# Patient Record
Sex: Male | Born: 1935 | Hispanic: No | Marital: Married
Health system: Southern US, Community
[De-identification: ages and names within clinical notes are randomized; demographics above are authoritative.]

---

## 2004-04-04 ENCOUNTER — Other Ambulatory Visit: Payer: Self-pay

## 2005-07-23 ENCOUNTER — Ambulatory Visit: Payer: Self-pay | Admitting: Unknown Physician Specialty

## 2005-08-20 ENCOUNTER — Ambulatory Visit: Payer: Self-pay | Admitting: Family Medicine

## 2006-04-12 ENCOUNTER — Ambulatory Visit: Payer: Self-pay | Admitting: Ophthalmology

## 2006-04-17 ENCOUNTER — Ambulatory Visit: Payer: Self-pay | Admitting: Ophthalmology

## 2006-06-19 ENCOUNTER — Ambulatory Visit: Payer: Self-pay | Admitting: Ophthalmology

## 2006-06-26 ENCOUNTER — Ambulatory Visit: Payer: Self-pay | Admitting: Ophthalmology

## 2007-04-25 ENCOUNTER — Ambulatory Visit: Payer: Self-pay | Admitting: Internal Medicine

## 2007-04-25 ENCOUNTER — Other Ambulatory Visit: Payer: Self-pay

## 2008-02-17 ENCOUNTER — Ambulatory Visit: Payer: Self-pay | Admitting: Nephrology

## 2008-11-03 ENCOUNTER — Inpatient Hospital Stay: Payer: Self-pay | Admitting: Internal Medicine

## 2008-11-11 ENCOUNTER — Ambulatory Visit: Payer: Self-pay | Admitting: Internal Medicine

## 2008-11-16 ENCOUNTER — Inpatient Hospital Stay: Payer: Self-pay | Admitting: Internal Medicine

## 2008-12-22 ENCOUNTER — Ambulatory Visit: Payer: Self-pay | Admitting: Unknown Physician Specialty

## 2009-01-06 ENCOUNTER — Ambulatory Visit: Payer: Self-pay | Admitting: Family Medicine

## 2009-01-26 ENCOUNTER — Ambulatory Visit: Payer: Self-pay | Admitting: Family Medicine

## 2009-01-26 ENCOUNTER — Ambulatory Visit: Payer: Self-pay | Admitting: Unknown Physician Specialty

## 2009-09-25 ENCOUNTER — Inpatient Hospital Stay: Payer: Self-pay | Admitting: Internal Medicine

## 2009-12-08 ENCOUNTER — Inpatient Hospital Stay: Payer: Self-pay | Admitting: Internal Medicine

## 2010-12-02 ENCOUNTER — Inpatient Hospital Stay: Payer: Self-pay | Admitting: Internal Medicine

## 2011-01-23 DIAGNOSIS — R079 Chest pain, unspecified: Secondary | ICD-10-CM

## 2011-02-08 ENCOUNTER — Inpatient Hospital Stay: Payer: Self-pay | Admitting: Specialist

## 2011-02-21 ENCOUNTER — Ambulatory Visit: Payer: Self-pay | Admitting: Vascular Surgery

## 2011-02-22 ENCOUNTER — Inpatient Hospital Stay: Payer: Self-pay | Admitting: Vascular Surgery

## 2011-08-02 ENCOUNTER — Ambulatory Visit: Payer: Self-pay | Admitting: Family Medicine

## 2011-08-30 ENCOUNTER — Emergency Department: Payer: Self-pay | Admitting: Emergency Medicine

## 2011-12-04 ENCOUNTER — Emergency Department: Payer: Self-pay | Admitting: Emergency Medicine

## 2011-12-04 LAB — COMPREHENSIVE METABOLIC PANEL
Anion Gap: 13 (ref 7–16)
Bilirubin,Total: 0.4 mg/dL (ref 0.2–1.0)
Calcium, Total: 8.4 mg/dL — ABNORMAL LOW (ref 8.5–10.1)
Chloride: 104 mmol/L (ref 98–107)
Co2: 24 mmol/L (ref 21–32)
Creatinine: 1.19 mg/dL (ref 0.60–1.30)
EGFR (African American): >60
EGFR (Non-African Amer.): >60
Glucose: 138 mg/dL — ABNORMAL HIGH (ref 65–99)
Osmolality: 285 (ref 275–301)
Potassium: 4.3 mmol/L (ref 3.5–5.1)
SGPT (ALT): 19 U/L
Sodium: 141 mmol/L (ref 136–145)
Total Protein: 7.3 g/dL (ref 6.4–8.2)

## 2011-12-04 LAB — CBC
HGB: 14.2 g/dL (ref 13.0–18.0)
MCH: 32.8 pg (ref 26.0–34.0)
MCHC: 33.9 g/dL (ref 32.0–36.0)
MCV: 97 fL (ref 80–100)
Platelet: 219 10*3/uL (ref 150–440)
RBC: 4.33 10*6/uL — ABNORMAL LOW (ref 4.40–5.90)
RDW: 13.5 % (ref 11.5–14.5)

## 2011-12-04 LAB — URINALYSIS, COMPLETE
Bilirubin,UR: NEGATIVE
Blood: NEGATIVE
Hyaline Cast: 6
Ketone: NEGATIVE
Leukocyte Esterase: NEGATIVE
Ph: 6 (ref 4.5–8.0)
Protein: NEGATIVE
RBC,UR: 1 /HPF (ref 0–5)
Specific Gravity: 1.01 (ref 1.003–1.030)
Squamous Epithelial: 1

## 2011-12-04 LAB — TROPONIN I: Troponin-I: <0.02 ng/mL

## 2011-12-04 LAB — CK TOTAL AND CKMB (NOT AT ARMC): CK, Total: 116 U/L (ref 35–232)

## 2011-12-14 ENCOUNTER — Emergency Department: Payer: Self-pay | Admitting: Emergency Medicine

## 2011-12-14 LAB — COMPREHENSIVE METABOLIC PANEL
Anion Gap: 9 (ref 7–16)
BUN: 16 mg/dL (ref 7–18)
Bilirubin,Total: 0.3 mg/dL (ref 0.2–1.0)
Calcium, Total: 8.6 mg/dL (ref 8.5–10.1)
Chloride: 101 mmol/L (ref 98–107)
Co2: 29 mmol/L (ref 21–32)
EGFR (African American): >60
Glucose: 113 mg/dL — ABNORMAL HIGH (ref 65–99)
Potassium: 4.2 mmol/L (ref 3.5–5.1)
SGOT(AST): 27 U/L (ref 15–37)
Total Protein: 7.6 g/dL (ref 6.4–8.2)

## 2011-12-14 LAB — CBC
HGB: 15 g/dL (ref 13.0–18.0)
MCH: 32.9 pg (ref 26.0–34.0)
MCV: 98 fL (ref 80–100)
Platelet: 227 10*3/uL (ref 150–440)
RBC: 4.56 10*6/uL (ref 4.40–5.90)

## 2011-12-14 LAB — URINALYSIS, COMPLETE
Bacteria: NONE SEEN
Blood: NEGATIVE
Protein: NEGATIVE
RBC,UR: NONE SEEN /HPF (ref 0–5)

## 2011-12-14 LAB — CK TOTAL AND CKMB (NOT AT ARMC): CK, Total: 118 U/L (ref 35–232)

## 2011-12-24 ENCOUNTER — Observation Stay: Payer: Self-pay | Admitting: Internal Medicine

## 2011-12-24 LAB — CBC
HGB: 13.8 g/dL (ref 13.0–18.0)
Platelet: 210 10*3/uL (ref 150–440)
RBC: 4.28 10*6/uL — ABNORMAL LOW (ref 4.40–5.90)
RDW: 13 % (ref 11.5–14.5)

## 2011-12-24 LAB — BASIC METABOLIC PANEL
Anion Gap: 9 (ref 7–16)
BUN: 16 mg/dL (ref 7–18)
Chloride: 104 mmol/L (ref 98–107)
Co2: 26 mmol/L (ref 21–32)
EGFR (African American): >60
EGFR (Non-African Amer.): 56 — ABNORMAL LOW
Sodium: 139 mmol/L (ref 136–145)

## 2011-12-24 LAB — URINALYSIS, COMPLETE
Bilirubin,UR: NEGATIVE
Leukocyte Esterase: NEGATIVE
Nitrite: NEGATIVE
Ph: 6 (ref 4.5–8.0)
Protein: NEGATIVE
Specific Gravity: 1.018 (ref 1.003–1.030)

## 2011-12-24 LAB — TROPONIN I: Troponin-I: <0.02 ng/mL

## 2011-12-24 LAB — HEPATIC FUNCTION PANEL A (ARMC)
Alkaline Phosphatase: 89 U/L (ref 50–136)
Bilirubin,Total: 0.5 mg/dL (ref 0.2–1.0)

## 2011-12-25 LAB — COMPREHENSIVE METABOLIC PANEL
Albumin: 3.2 g/dL — ABNORMAL LOW (ref 3.4–5.0)
Anion Gap: 9 (ref 7–16)
BUN: 14 mg/dL (ref 7–18)
Calcium, Total: 8.4 mg/dL — ABNORMAL LOW (ref 8.5–10.1)
Chloride: 107 mmol/L (ref 98–107)
Co2: 24 mmol/L (ref 21–32)
EGFR (African American): >60
EGFR (Non-African Amer.): >60
Osmolality: 279 (ref 275–301)
Potassium: 3.9 mmol/L (ref 3.5–5.1)
SGOT(AST): 28 U/L (ref 15–37)
Sodium: 140 mmol/L (ref 136–145)

## 2011-12-25 LAB — CBC WITH DIFFERENTIAL/PLATELET
Basophil #: 0 10*3/uL (ref 0.0–0.1)
Eosinophil #: 0.2 10*3/uL (ref 0.0–0.7)
Eosinophil %: 2.5 %
HCT: 37 % — ABNORMAL LOW (ref 40.0–52.0)
HGB: 12.4 g/dL — ABNORMAL LOW (ref 13.0–18.0)
MCV: 96 fL (ref 80–100)
Monocyte %: 10 %
Platelet: 181 10*3/uL (ref 150–440)
RBC: 3.86 10*6/uL — ABNORMAL LOW (ref 4.40–5.90)
RDW: 12.7 % (ref 11.5–14.5)

## 2011-12-25 LAB — APTT: Activated PTT: 31.8 secs (ref 23.6–35.9)

## 2011-12-25 LAB — PROTIME-INR
INR: 1
Prothrombin Time: 13.6 secs (ref 11.5–14.7)

## 2011-12-25 LAB — LIPID PANEL: Triglycerides: 139 mg/dL (ref 0–200)

## 2011-12-28 ENCOUNTER — Emergency Department: Payer: Self-pay | Admitting: Internal Medicine

## 2011-12-28 LAB — COMPREHENSIVE METABOLIC PANEL
Alkaline Phosphatase: 79 U/L (ref 50–136)
BUN: 14 mg/dL (ref 7–18)
Chloride: 107 mmol/L (ref 98–107)
Co2: 23 mmol/L (ref 21–32)
Creatinine: 1.35 mg/dL — ABNORMAL HIGH (ref 0.60–1.30)
EGFR (African American): 58 — ABNORMAL LOW
EGFR (Non-African Amer.): 50 — ABNORMAL LOW
Glucose: 91 mg/dL (ref 65–99)
Osmolality: 283 (ref 275–301)
Potassium: 3.8 mmol/L (ref 3.5–5.1)
SGPT (ALT): 16 U/L
Sodium: 142 mmol/L (ref 136–145)

## 2011-12-28 LAB — URINALYSIS, COMPLETE
Bilirubin,UR: NEGATIVE
Glucose,UR: NEGATIVE mg/dL (ref 0–75)
Hyaline Cast: 3
Ketone: NEGATIVE
Leukocyte Esterase: NEGATIVE
Nitrite: NEGATIVE
Ph: 5 (ref 4.5–8.0)
Specific Gravity: 1.01 (ref 1.003–1.030)

## 2011-12-28 LAB — CBC
HCT: 40.5 % (ref 40.0–52.0)
HGB: 13.3 g/dL (ref 13.0–18.0)
Platelet: 233 10*3/uL (ref 150–440)
RBC: 4.2 10*6/uL — ABNORMAL LOW (ref 4.40–5.90)
RDW: 12.8 % (ref 11.5–14.5)
WBC: 9.4 10*3/uL (ref 3.8–10.6)

## 2011-12-28 LAB — DRUG SCREEN, URINE
Amphetamines, Ur Screen: NEGATIVE (ref ?–1000)
Benzodiazepine, Ur Scrn: NEGATIVE (ref ?–200)
Cannabinoid 50 Ng, Ur ~~LOC~~: NEGATIVE (ref ?–50)
Cocaine Metabolite,Ur ~~LOC~~: NEGATIVE (ref ?–300)
MDMA (Ecstasy)Ur Screen: NEGATIVE (ref ?–500)
Methadone, Ur Screen: NEGATIVE (ref ?–300)
Phencyclidine (PCP) Ur S: NEGATIVE (ref ?–25)
Tricyclic, Ur Screen: NEGATIVE (ref ?–1000)

## 2011-12-28 LAB — PROTIME-INR: INR: 1

## 2011-12-28 LAB — TROPONIN I: Troponin-I: <0.02 ng/mL

## 2011-12-29 LAB — CULTURE, BLOOD (SINGLE)

## 2012-01-06 ENCOUNTER — Emergency Department: Payer: Self-pay | Admitting: *Deleted

## 2012-01-06 ENCOUNTER — Inpatient Hospital Stay: Payer: Self-pay | Admitting: Internal Medicine

## 2012-01-06 LAB — COMPREHENSIVE METABOLIC PANEL
Alkaline Phosphatase: 116 U/L (ref 50–136)
BUN: 19 mg/dL — ABNORMAL HIGH (ref 7–18)
Bilirubin,Total: 0.3 mg/dL (ref 0.2–1.0)
Chloride: 107 mmol/L (ref 98–107)
Co2: 27 mmol/L (ref 21–32)
Creatinine: 1.7 mg/dL — ABNORMAL HIGH (ref 0.60–1.30)
EGFR (African American): 44 — ABNORMAL LOW
Osmolality: 289 (ref 275–301)
Potassium: 4 mmol/L (ref 3.5–5.1)
SGPT (ALT): 17 U/L
Sodium: 144 mmol/L (ref 136–145)
Total Protein: 7.6 g/dL (ref 6.4–8.2)

## 2012-01-06 LAB — URINALYSIS, COMPLETE
Bilirubin,UR: NEGATIVE
Blood: NEGATIVE
Glucose,UR: NEGATIVE mg/dL (ref 0–75)
Ketone: NEGATIVE
Nitrite: NEGATIVE
Squamous Epithelial: 19
WBC UR: 27 /HPF (ref 0–5)

## 2012-01-06 LAB — CBC WITH DIFFERENTIAL/PLATELET
Basophil #: 0 10*3/uL (ref 0.0–0.1)
Basophil %: 0.2 %
HCT: 43.6 % (ref 40.0–52.0)
Lymphocyte #: 1.8 10*3/uL (ref 1.0–3.6)
Lymphocyte %: 14.5 %
MCH: 32.1 pg (ref 26.0–34.0)
MCHC: 32.9 g/dL (ref 32.0–36.0)
MCV: 98 fL (ref 80–100)
Monocyte #: 0.8 x10 3/mm (ref 0.2–1.0)
Monocyte %: 6.5 %
Neutrophil #: 9.3 10*3/uL — ABNORMAL HIGH (ref 1.4–6.5)
Neutrophil %: 76 %
Platelet: 230 10*3/uL (ref 150–440)
RDW: 12.8 % (ref 11.5–14.5)

## 2012-01-07 LAB — CBC WITH DIFFERENTIAL/PLATELET
Basophil #: 0 10*3/uL (ref 0.0–0.1)
Basophil %: 0.3 %
Eosinophil #: 0.3 10*3/uL (ref 0.0–0.7)
HGB: 12.6 g/dL — ABNORMAL LOW (ref 13.0–18.0)
Lymphocyte #: 2 10*3/uL (ref 1.0–3.6)
MCH: 31.5 pg (ref 26.0–34.0)
MCHC: 32.5 g/dL (ref 32.0–36.0)
MCV: 97 fL (ref 80–100)
Monocyte %: 10.5 %
Neutrophil #: 5.4 10*3/uL (ref 1.4–6.5)
Platelet: 199 10*3/uL (ref 150–440)
WBC: 8.7 10*3/uL (ref 3.8–10.6)

## 2012-01-07 LAB — BASIC METABOLIC PANEL
Anion Gap: 7 (ref 7–16)
BUN: 13 mg/dL (ref 7–18)
Calcium, Total: 8.3 mg/dL — ABNORMAL LOW (ref 8.5–10.1)
Chloride: 108 mmol/L — ABNORMAL HIGH (ref 98–107)
Co2: 27 mmol/L (ref 21–32)
Creatinine: 1.26 mg/dL (ref 0.60–1.30)
EGFR (African American): >60
EGFR (Non-African Amer.): 54 — ABNORMAL LOW
Osmolality: 283 (ref 275–301)
Potassium: 4 mmol/L (ref 3.5–5.1)
Sodium: 142 mmol/L (ref 136–145)

## 2012-01-09 LAB — URINE CULTURE

## 2012-05-21 ENCOUNTER — Emergency Department: Payer: Self-pay

## 2012-05-21 LAB — BASIC METABOLIC PANEL
Anion Gap: 5 — ABNORMAL LOW (ref 7–16)
BUN: 15 mg/dL (ref 7–18)
Calcium, Total: 8.9 mg/dL (ref 8.5–10.1)
EGFR (African American): >60
EGFR (Non-African Amer.): >60
Glucose: 108 mg/dL — ABNORMAL HIGH (ref 65–99)
Osmolality: 281 (ref 275–301)

## 2012-05-21 LAB — URINALYSIS, COMPLETE
Bilirubin,UR: NEGATIVE
Blood: NEGATIVE
Glucose,UR: NEGATIVE mg/dL (ref 0–75)
Ketone: NEGATIVE
Nitrite: NEGATIVE
Ph: 6 (ref 4.5–8.0)
RBC,UR: 2 /HPF (ref 0–5)

## 2012-05-21 LAB — CBC
HCT: 39.8 % — ABNORMAL LOW (ref 40.0–52.0)
HGB: 13.3 g/dL (ref 13.0–18.0)
MCHC: 33.3 g/dL (ref 32.0–36.0)
RBC: 4.19 10*6/uL — ABNORMAL LOW (ref 4.40–5.90)
WBC: 13.5 10*3/uL — ABNORMAL HIGH (ref 3.8–10.6)

## 2012-06-14 ENCOUNTER — Other Ambulatory Visit: Payer: Self-pay | Admitting: Family Medicine

## 2012-06-14 LAB — URINALYSIS, COMPLETE
Bilirubin,UR: NEGATIVE
Glucose,UR: NEGATIVE mg/dL (ref 0–75)
Ketone: NEGATIVE
Protein: 100
RBC,UR: 293 /HPF (ref 0–5)
Renal Epithelial: 1
Squamous Epithelial: 35

## 2012-06-14 LAB — CBC WITH DIFFERENTIAL/PLATELET
Basophil %: 0.5 %
Eosinophil #: 0.4 10*3/uL (ref 0.0–0.7)
Eosinophil %: 4.4 %
HGB: 14 g/dL (ref 13.0–18.0)
Lymphocyte #: 1.9 10*3/uL (ref 1.0–3.6)
MCH: 32.4 pg (ref 26.0–34.0)
MCHC: 34.1 g/dL (ref 32.0–36.0)
MCV: 95 fL (ref 80–100)
Monocyte #: 0.6 x10 3/mm (ref 0.2–1.0)
Neutrophil #: 6.1 10*3/uL (ref 1.4–6.5)
Neutrophil %: 67.6 %
WBC: 9 10*3/uL (ref 3.8–10.6)

## 2012-06-14 LAB — COMPREHENSIVE METABOLIC PANEL
Albumin: 3.1 g/dL — ABNORMAL LOW (ref 3.4–5.0)
Anion Gap: 8 (ref 7–16)
BUN: 12 mg/dL (ref 7–18)
Bilirubin,Total: 0.3 mg/dL (ref 0.2–1.0)
Chloride: 107 mmol/L (ref 98–107)
EGFR (African American): >60
EGFR (Non-African Amer.): >60
Glucose: 94 mg/dL (ref 65–99)
SGOT(AST): 17 U/L (ref 15–37)
SGPT (ALT): 12 U/L (ref 12–78)
Total Protein: 6.3 g/dL — ABNORMAL LOW (ref 6.4–8.2)

## 2012-06-18 LAB — URINE CULTURE

## 2012-07-01 ENCOUNTER — Ambulatory Visit: Payer: Self-pay | Admitting: Urology

## 2012-07-19 ENCOUNTER — Ambulatory Visit: Payer: Self-pay | Admitting: Family Medicine

## 2012-07-19 LAB — URINALYSIS, COMPLETE
Bilirubin,UR: NEGATIVE
Glucose,UR: NEGATIVE mg/dL
Nitrite: POSITIVE
Ph: 6
Protein: NEGATIVE
RBC,UR: 2 /HPF
Specific Gravity: 1.02
Squamous Epithelial: <1
WBC UR: 21 /HPF

## 2012-07-21 LAB — URINE CULTURE

## 2012-07-22 ENCOUNTER — Inpatient Hospital Stay: Payer: Self-pay | Admitting: Internal Medicine

## 2012-07-22 LAB — BASIC METABOLIC PANEL
Anion Gap: 10 (ref 7–16)
BUN: 23 mg/dL — ABNORMAL HIGH (ref 7–18)
Chloride: 106 mmol/L (ref 98–107)
Co2: 26 mmol/L (ref 21–32)
EGFR (African American): >60
Potassium: 4.8 mmol/L (ref 3.5–5.1)
Sodium: 142 mmol/L (ref 136–145)

## 2012-07-22 LAB — CBC
HGB: 14.5 g/dL (ref 13.0–18.0)
MCHC: 34.2 g/dL (ref 32.0–36.0)
RDW: 14.7 % — ABNORMAL HIGH (ref 11.5–14.5)
WBC: 19.4 10*3/uL — ABNORMAL HIGH (ref 3.8–10.6)

## 2012-07-23 LAB — URINALYSIS, COMPLETE
Bacteria: NONE SEEN
Bilirubin,UR: NEGATIVE
Blood: NEGATIVE
Glucose,UR: NEGATIVE mg/dL (ref 0–75)
Leukocyte Esterase: NEGATIVE
Nitrite: NEGATIVE
Ph: 6 (ref 4.5–8.0)
Protein: 30
RBC,UR: <1 /HPF (ref 0–5)
Squamous Epithelial: 1

## 2012-07-23 LAB — CBC WITH DIFFERENTIAL/PLATELET
Basophil #: 0.1 10*3/uL (ref 0.0–0.1)
Basophil %: 0.5 %
HCT: 40.8 % (ref 40.0–52.0)
Lymphocyte #: 1.3 10*3/uL (ref 1.0–3.6)
MCH: 30.3 pg (ref 26.0–34.0)
MCHC: 32.1 g/dL (ref 32.0–36.0)
MCV: 94 fL (ref 80–100)
Monocyte %: 4.8 %
Neutrophil #: 16.6 10*3/uL — ABNORMAL HIGH (ref 1.4–6.5)
RDW: 14.5 % (ref 11.5–14.5)
WBC: 18.9 10*3/uL — ABNORMAL HIGH (ref 3.8–10.6)

## 2012-07-24 LAB — BASIC METABOLIC PANEL
Anion Gap: 11 (ref 7–16)
BUN: 19 mg/dL — ABNORMAL HIGH (ref 7–18)
Co2: 28 mmol/L (ref 21–32)
Creatinine: 0.74 mg/dL (ref 0.60–1.30)
EGFR (African American): >60
Potassium: 3.6 mmol/L (ref 3.5–5.1)
Sodium: 148 mmol/L — ABNORMAL HIGH (ref 136–145)

## 2012-07-25 LAB — BASIC METABOLIC PANEL
Anion Gap: 11 (ref 7–16)
BUN: 17 mg/dL (ref 7–18)
Chloride: 109 mmol/L — ABNORMAL HIGH (ref 98–107)
Co2: 28 mmol/L (ref 21–32)
Creatinine: 0.84 mg/dL (ref 0.60–1.30)
EGFR (African American): >60
Glucose: 116 mg/dL — ABNORMAL HIGH (ref 65–99)
Osmolality: 297 (ref 275–301)
Potassium: 3.5 mmol/L (ref 3.5–5.1)
Sodium: 148 mmol/L — ABNORMAL HIGH (ref 136–145)

## 2012-07-25 LAB — CBC WITH DIFFERENTIAL/PLATELET
Basophil #: 0.1 10*3/uL (ref 0.0–0.1)
Basophil %: 0.2 %
Eosinophil #: 0 10*3/uL (ref 0.0–0.7)
Lymphocyte %: 4.6 %
MCH: 30.2 pg (ref 26.0–34.0)
MCHC: 32.3 g/dL (ref 32.0–36.0)
Monocyte #: 1.3 x10 3/mm — ABNORMAL HIGH (ref 0.2–1.0)
Neutrophil #: 23.7 10*3/uL — ABNORMAL HIGH (ref 1.4–6.5)
Neutrophil %: 90 %
Platelet: 265 10*3/uL (ref 150–440)
RDW: 14.6 % — ABNORMAL HIGH (ref 11.5–14.5)

## 2012-07-28 LAB — CULTURE, BLOOD (SINGLE)

## 2012-08-17 DEATH — deceased

## 2013-10-31 IMAGING — CT CT HEAD WITHOUT CONTRAST
2 series · 15 of 30 positions shown, 19 images · non-contrast
Comparison: none

REASON FOR EXAM: weakness, difficulty ambulating "off balance"
COMMENTS:

PROCEDURE:     CT  - CT HEAD WITHOUT CONTRAST  - December 04, 2011  [DATE]
RESULT:     Comparison:  None
TECHNIQUE: Multiple axial images from the foramen magnum to the vertex were
obtained without IV contrast.

[Series 2: without · axial · non-contrast · 0.45mm/px · z∈[+426,+546]mm · 13 of 30 slices shown, 17 images]
[im 3/30  brain]
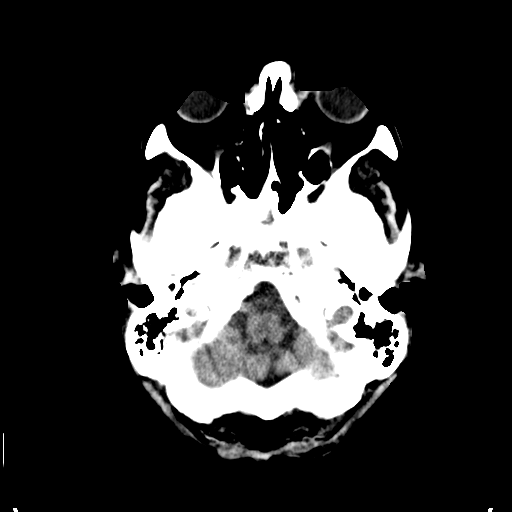
[im 3/30  bone]
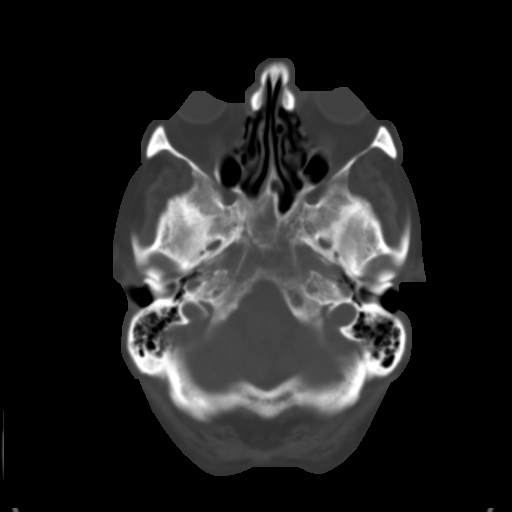
[im 5/30  brain]
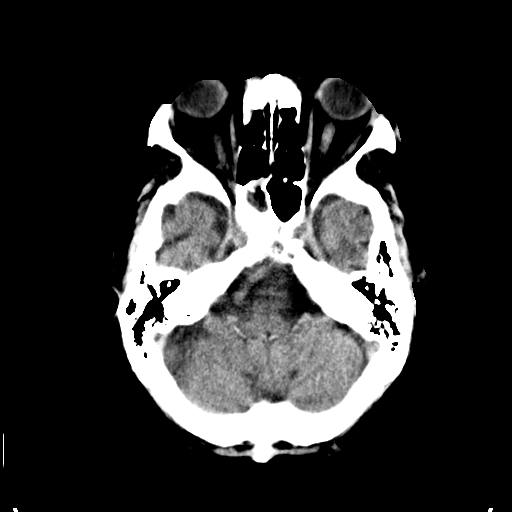
[im 7/30  brain]
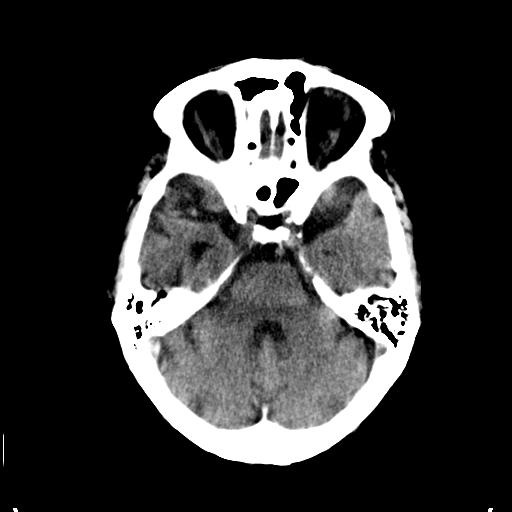
[im 9/30  brain]
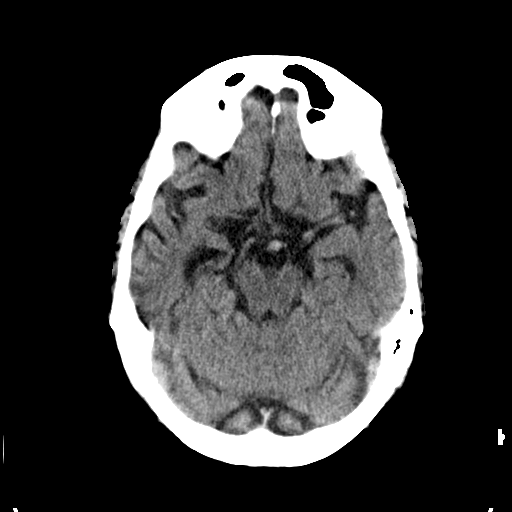
[im 11/30  brain]
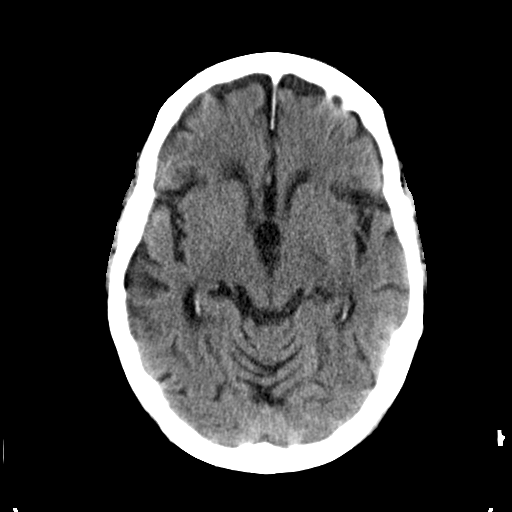
[im 11/30  bone]
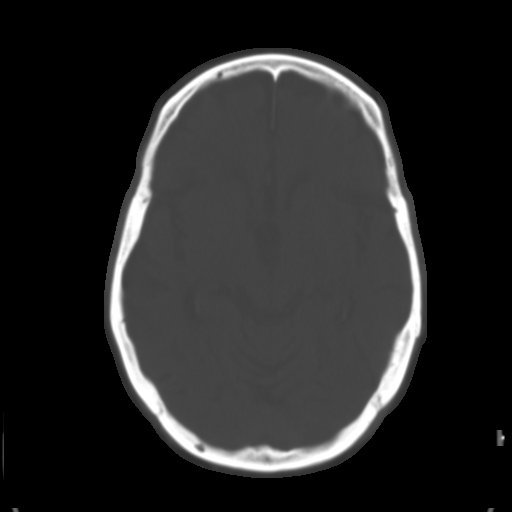
[im 13/30  brain]
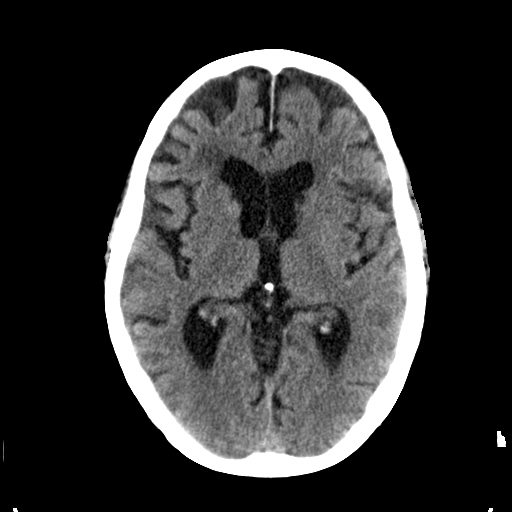
[im 15/30  brain]
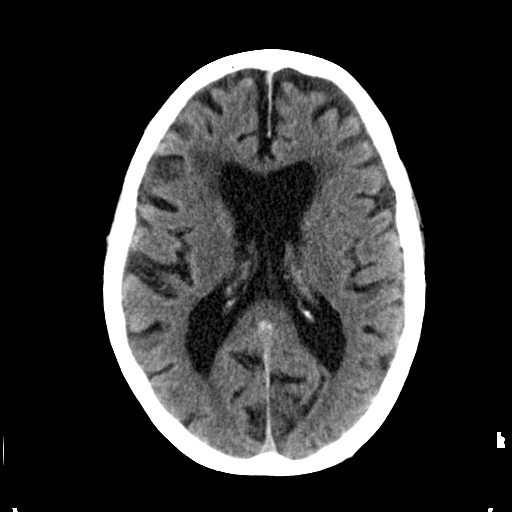
[im 17/30  brain]
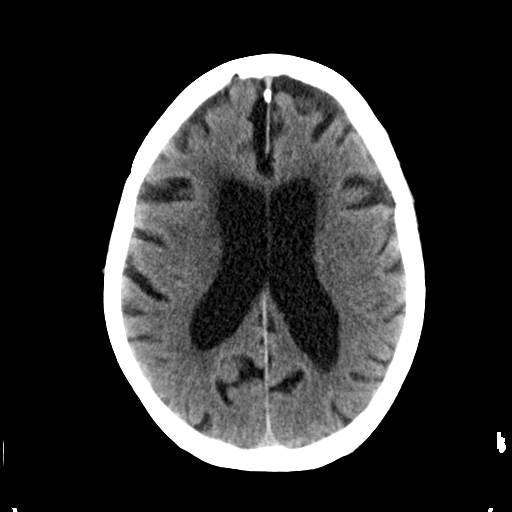
[im 19/30  brain]
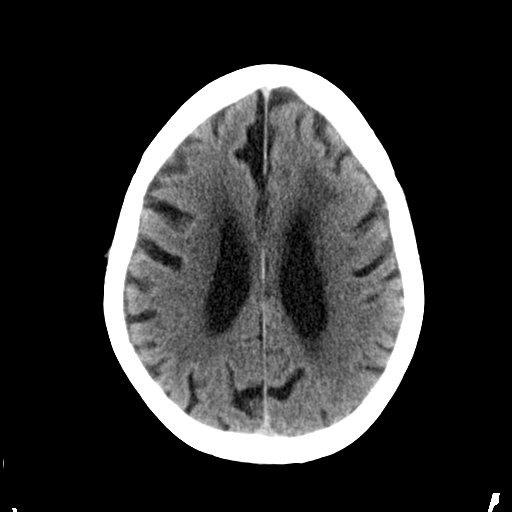
[im 19/30  bone]
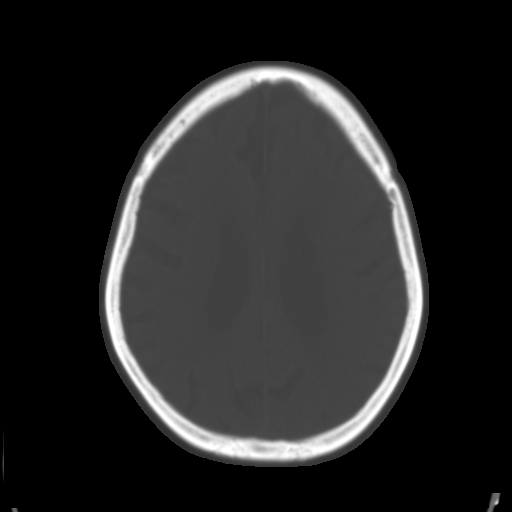
[im 21/30  brain]
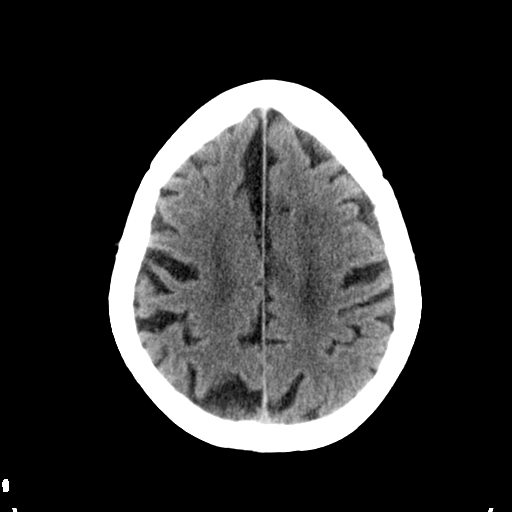
[im 23/30  brain]
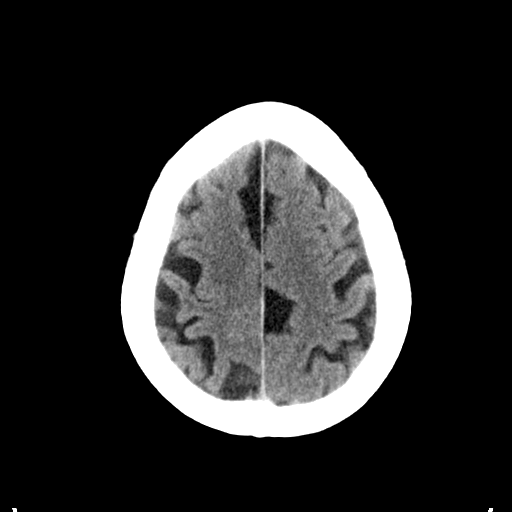
[im 25/30  brain]
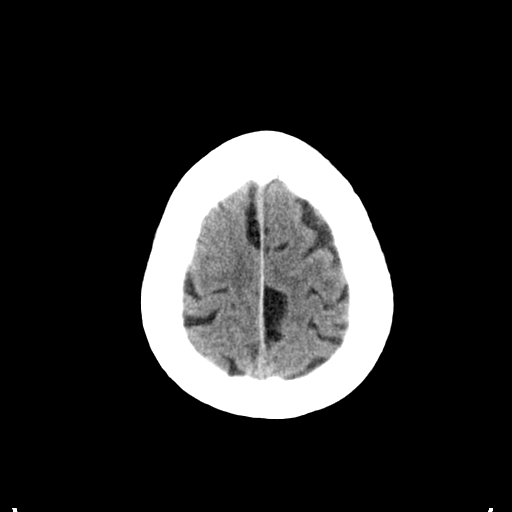
[im 27/30  brain]
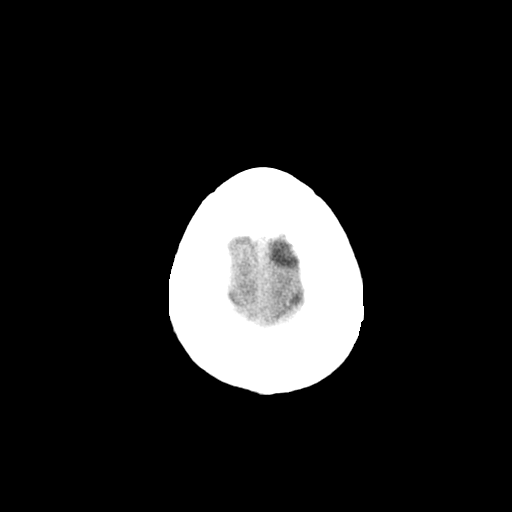
[im 27/30  bone]
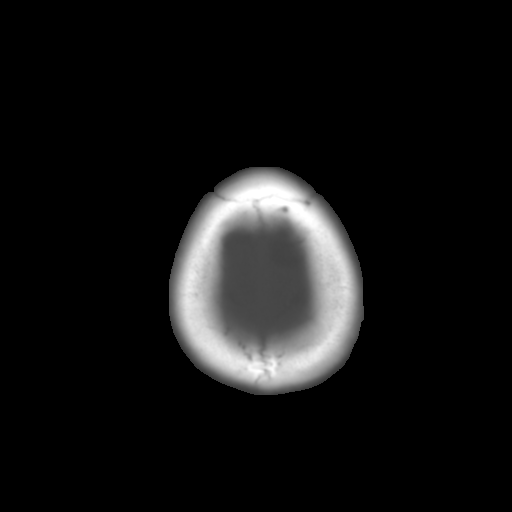

[Series 3: bone · axial · 0.45mm/px · z∈[+426,+446]mm · 2 of 30 slices shown]
[im 3/30  bone]
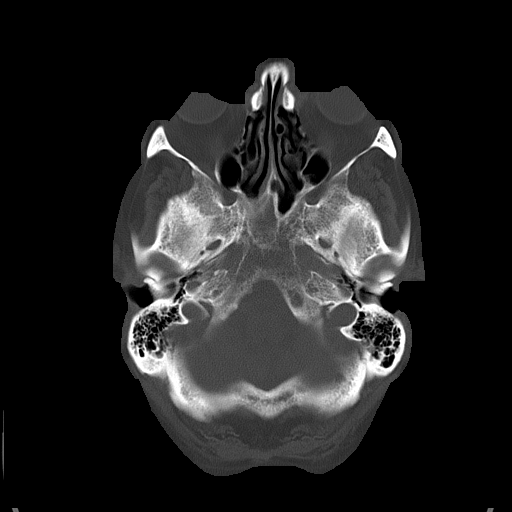
[im 7/30  bone]
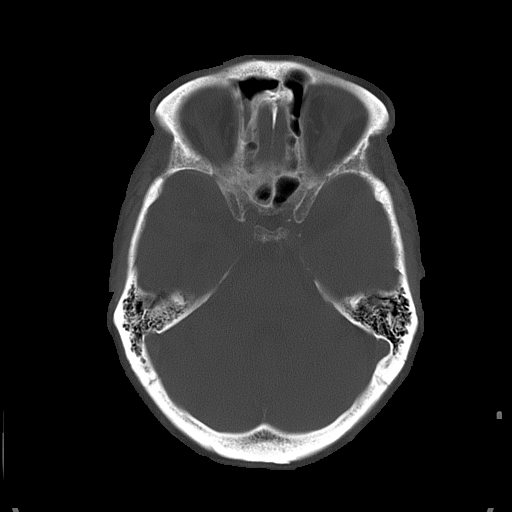

[15 of 30 positions shown; findings below may reference images not displayed]

FINDINGS: There is no evidence of mass effect, midline shift, or extra-axial fluid
collections.  There is no evidence of a space-occupying lesion or
intracranial hemorrhage. There is no evidence of a cortical-based area of
acute infarction. There is generalized cerebral atrophy. There is
periventricular white matter low attenuation likely secondary to
microangiopathy.

The ventricles and sulci are appropriate for the patient's age. The basal
cisterns are patent.

Visualized portions of the orbits are unremarkable. The visualized portions
of the paranasal sinuses and mastoid air cells are unremarkable.
Cerebrovascular atherosclerotic calcifications are noted.

The osseous structures are unremarkable.
IMPRESSION: No acute intracranial process.

## 2013-11-10 IMAGING — CR DG FEMUR 2V*L*
1 series · 5 of 5 positions shown · non-contrast
Comparison: none

REASON FOR EXAM: pain following trauma
COMMENTS:

PROCEDURE:     DXR - DXR FEMUR LEFT  - December 14, 2011  [DATE]
RESULT:     Degenerative change is noted about the left hip. Surgical clips
are noted about the lower extremity. No acute abnormality.

[Series 1: t femur proximal ap left · 0.14mm/px · 5 of 5 slices shown]
[im 1/5]
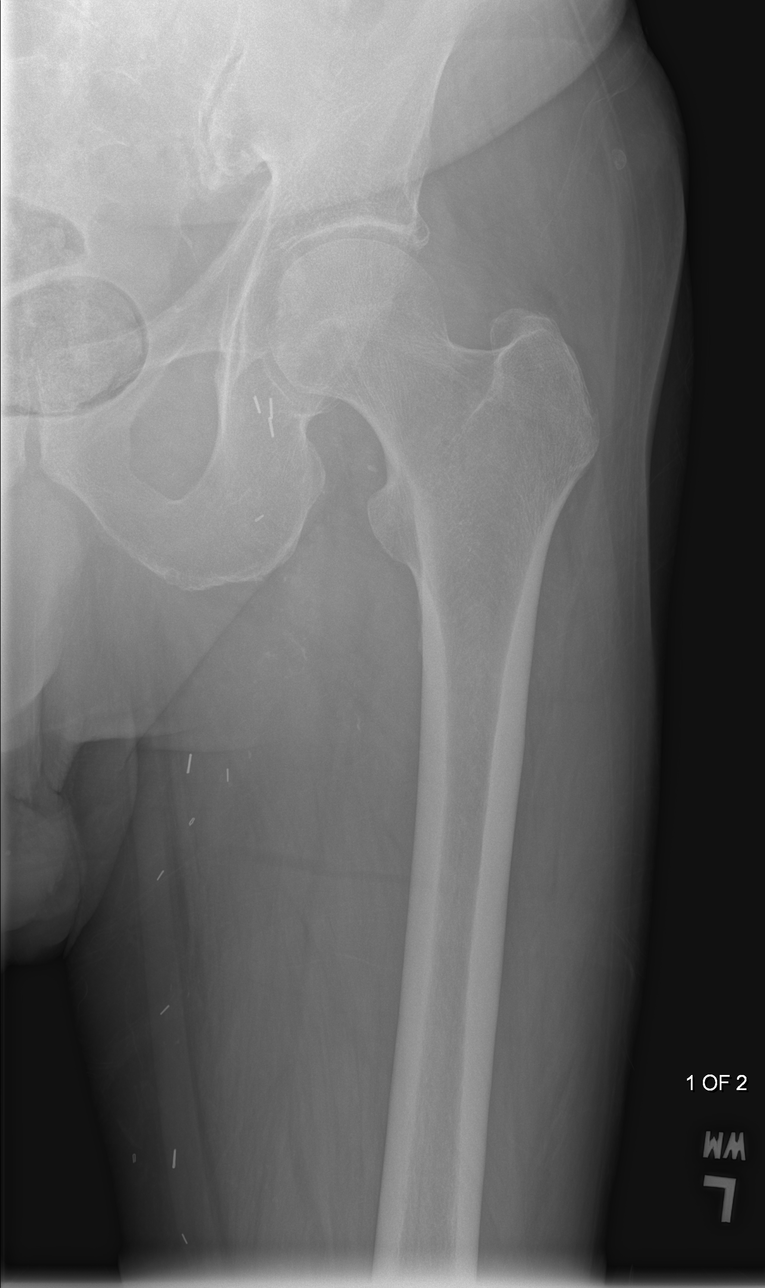
[im 2/5]
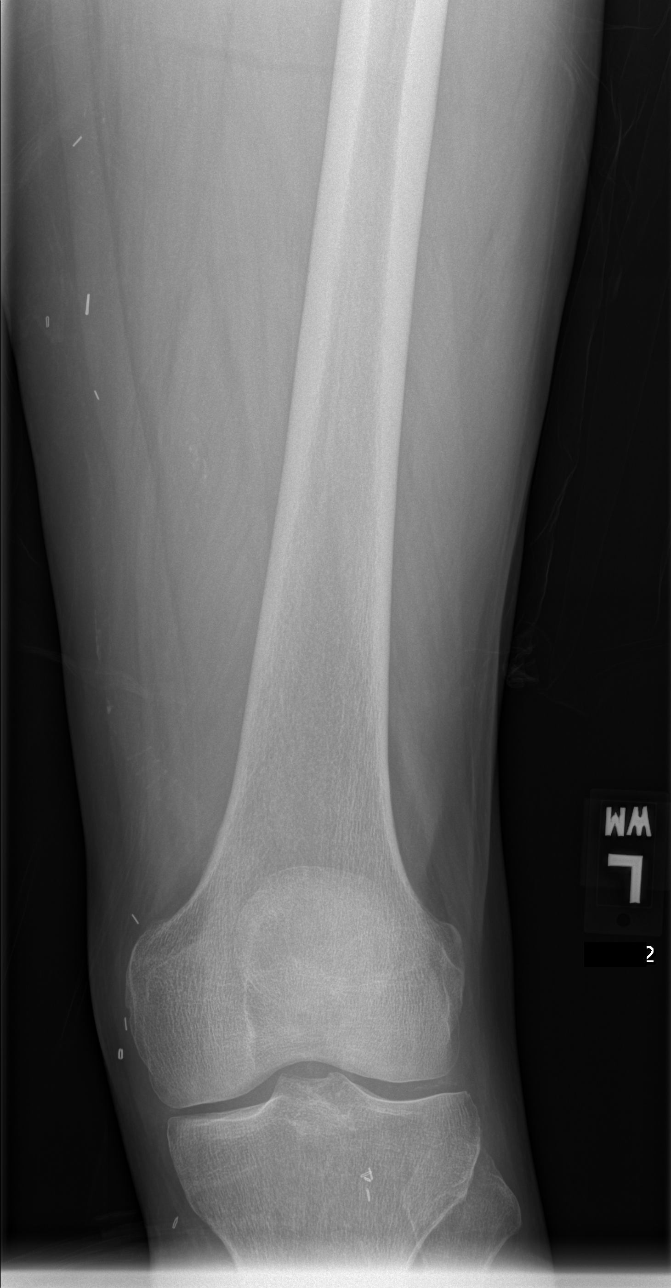
[im 3/5]
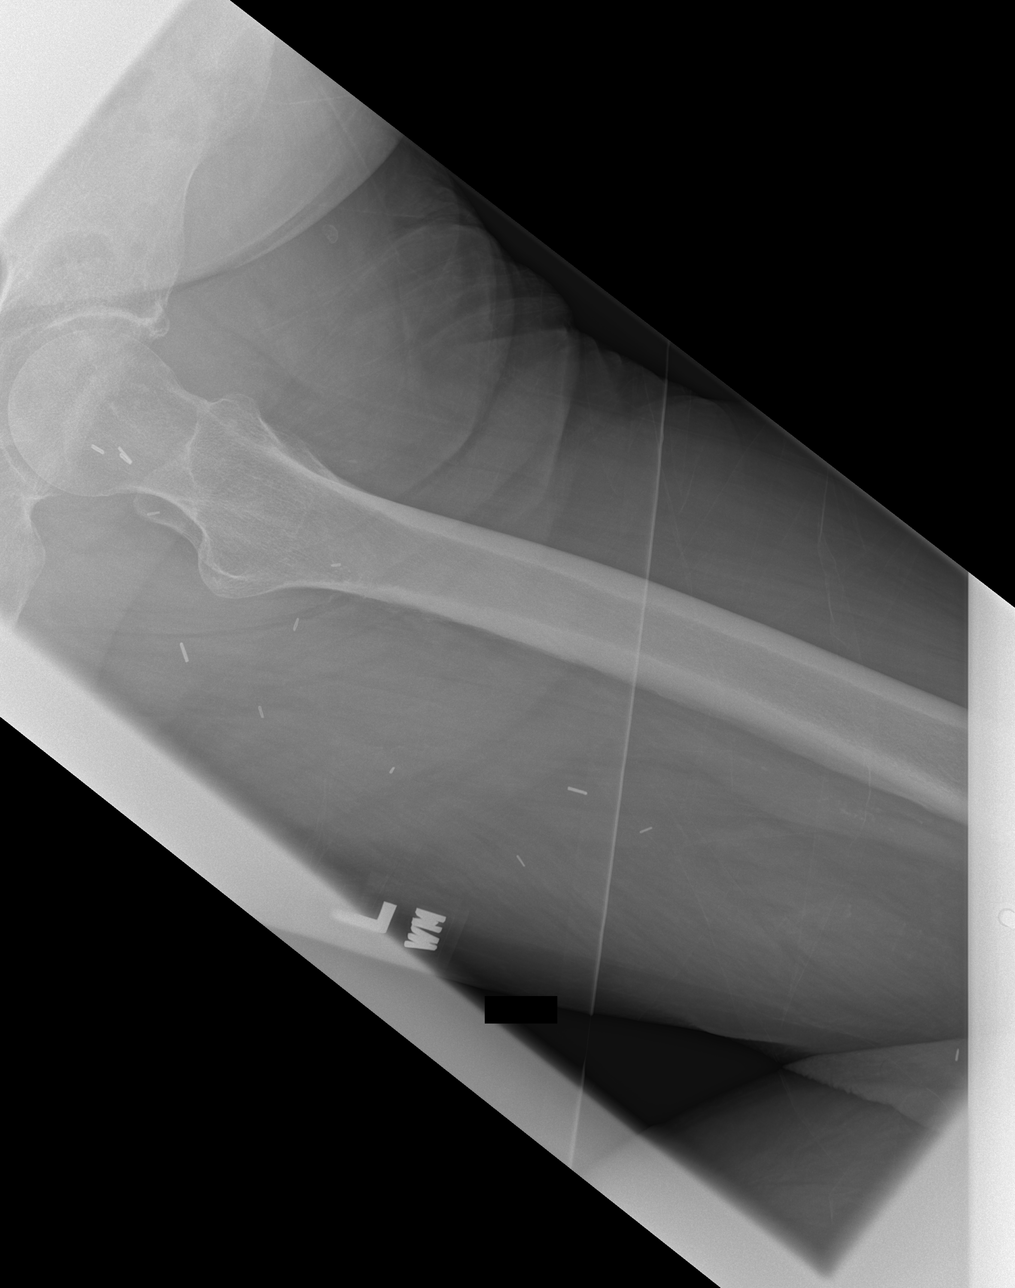
[im 4/5]
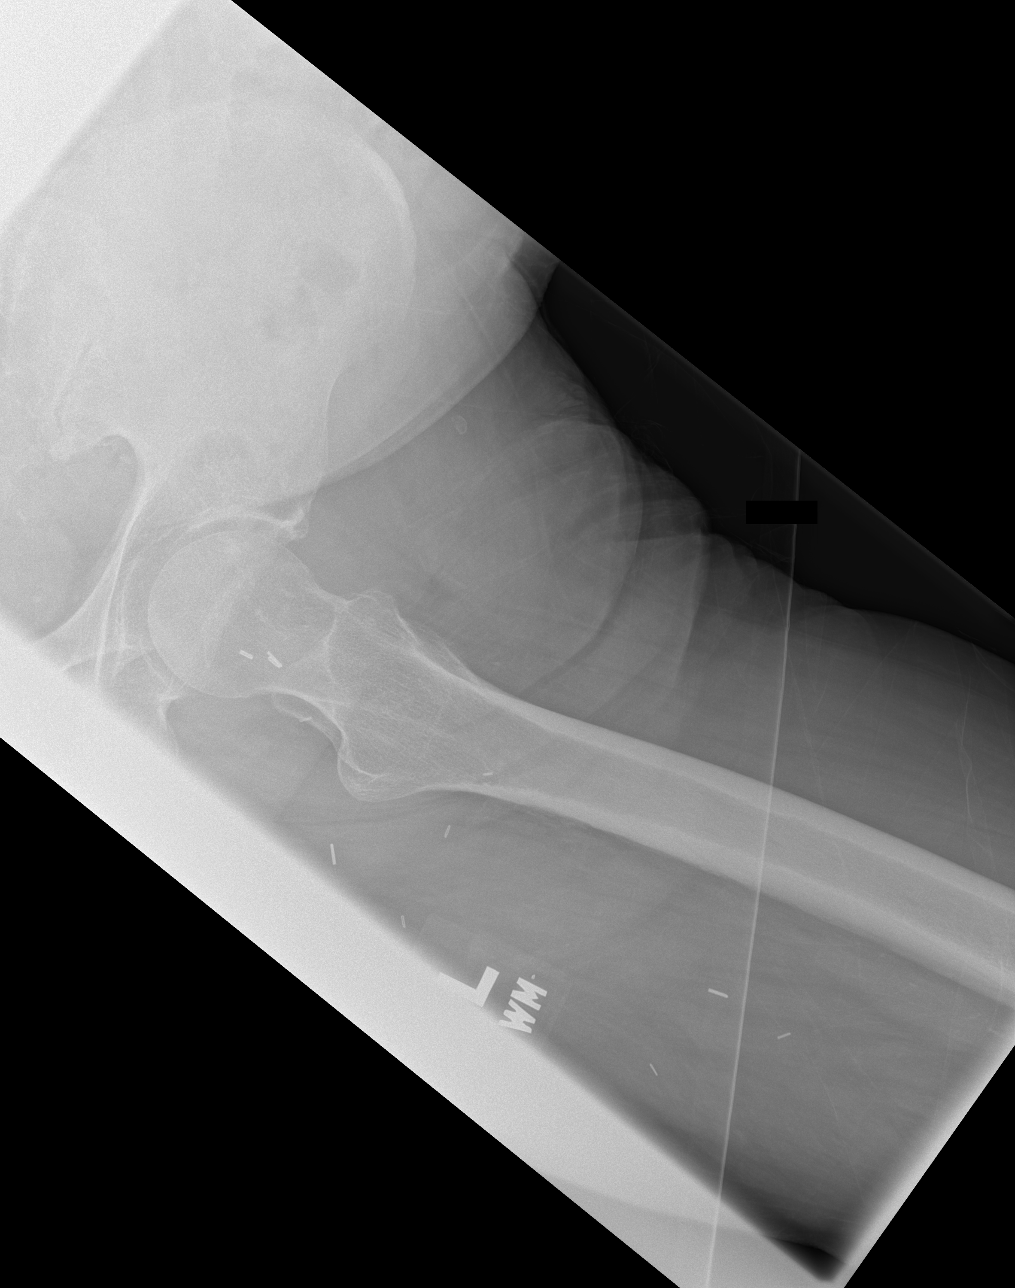
[im 5/5]
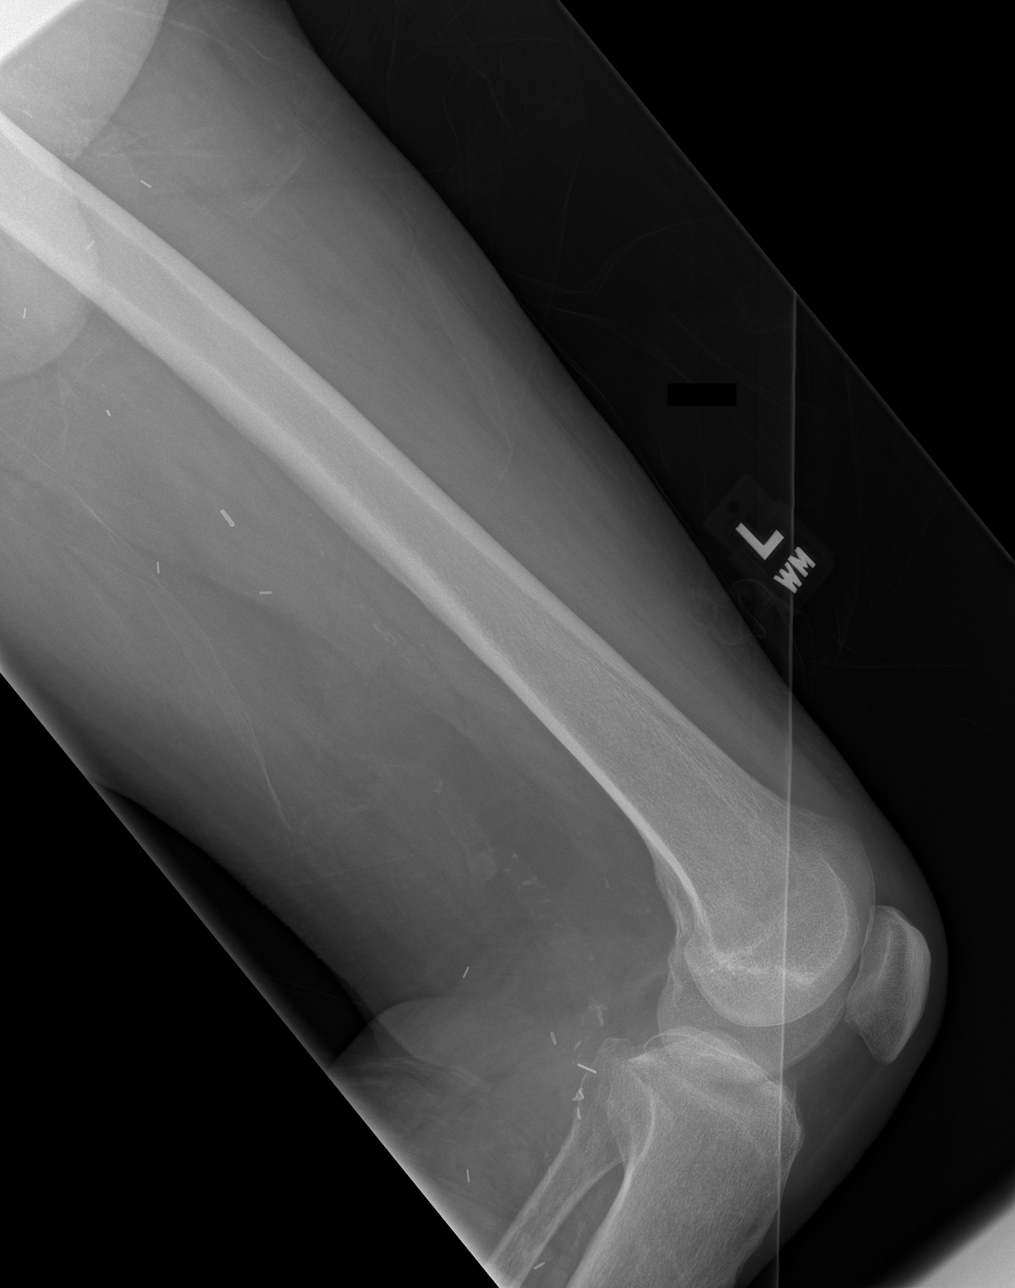

[5 of 5 positions shown; findings below may reference images not displayed]

IMPRESSION: No acute abnormality.

## 2013-11-10 IMAGING — CT CT HEAD WITHOUT CONTRAST
2 series · 16 of 30 positions shown, 20 images · non-contrast
Comparison: none

REASON FOR EXAM: head injury/hematoma
COMMENTS:

PROCEDURE:     CT  - CT HEAD WITHOUT CONTRAST  - December 14, 2011  [DATE]
RESULT:     History: Head injury.
Comparison study: Prior head CT of 12/04/2011.

[Series 2: without · axial · non-contrast · 0.45mm/px · z∈[-152,-27]mm · 13 of 31 slices shown, 17 images]
[im 3/31  brain]
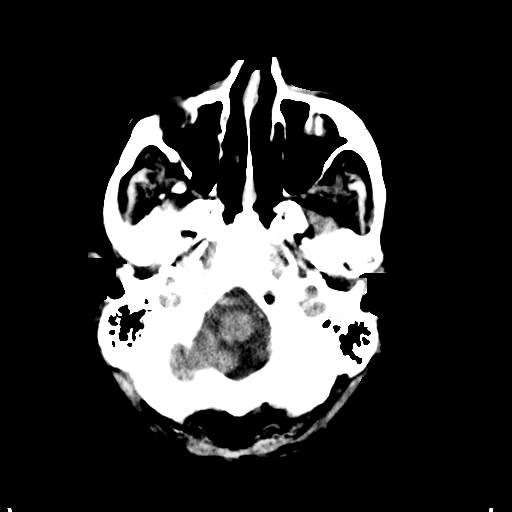
[im 3/31  bone]
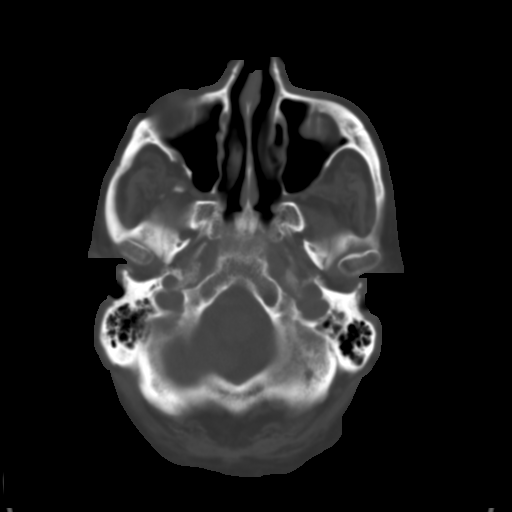
[im 5/31  brain]
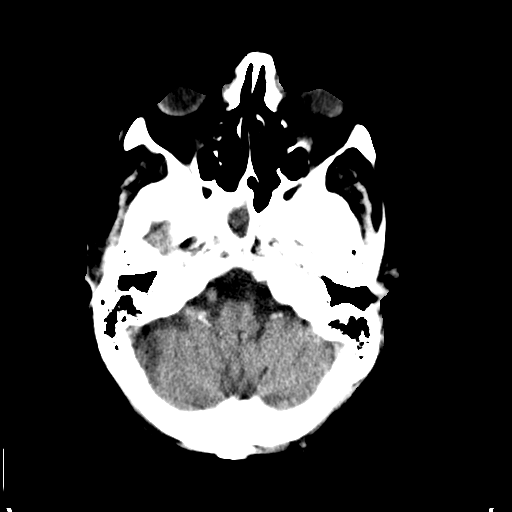
[im 7/31  brain]
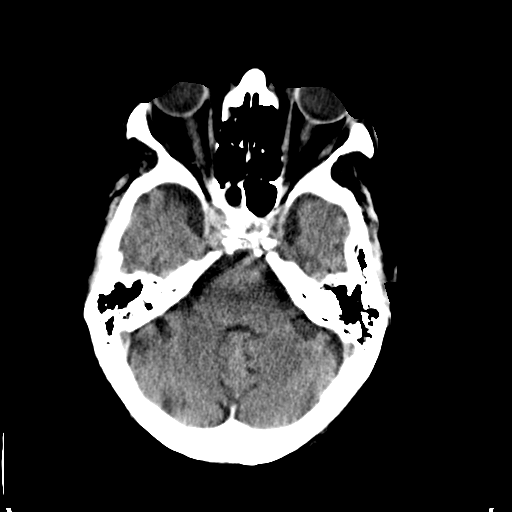
[im 9/31  brain]
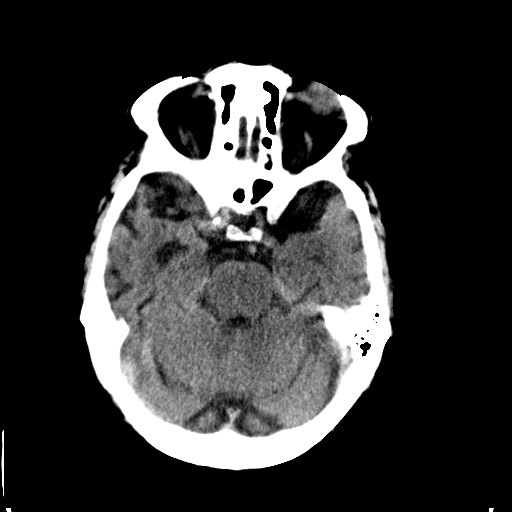
[im 11/31  brain]
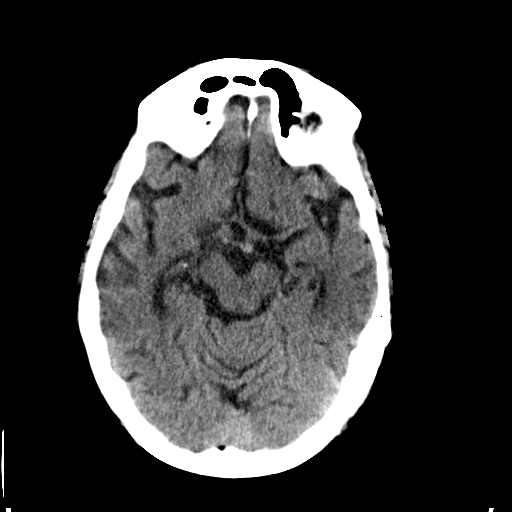
[im 11/31  bone]
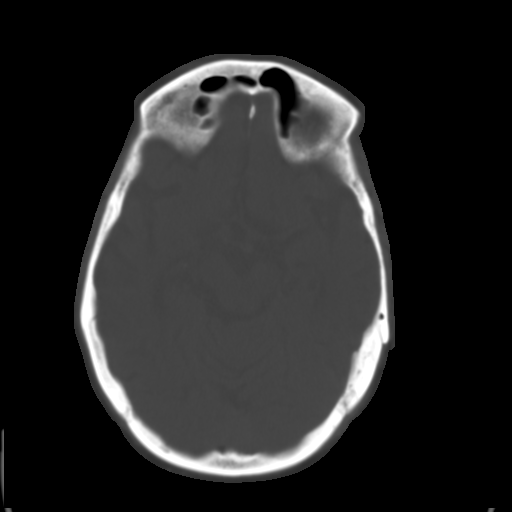
[im 13/31  brain]
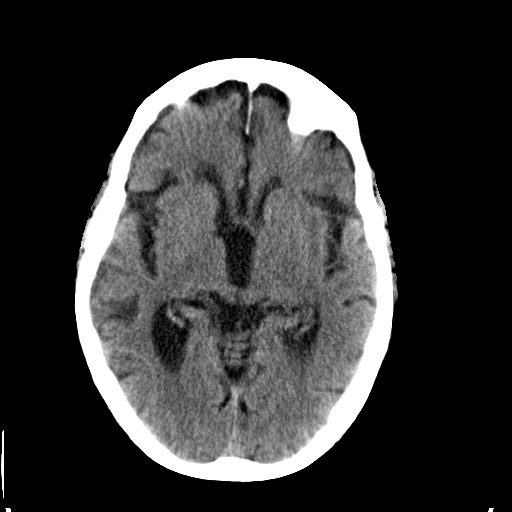
[im 16/31  brain]
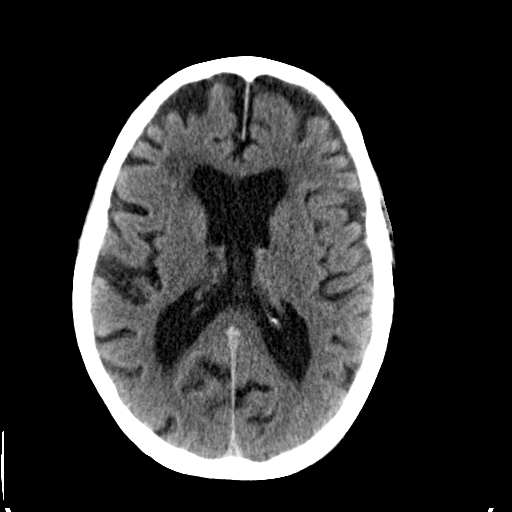
[im 18/31  brain]
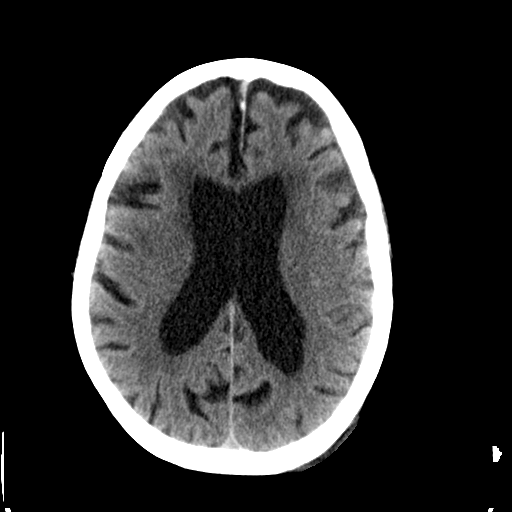
[im 20/31  brain]
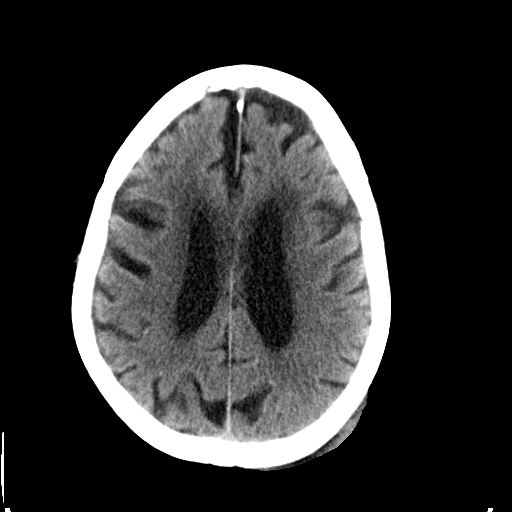
[im 20/31  bone]
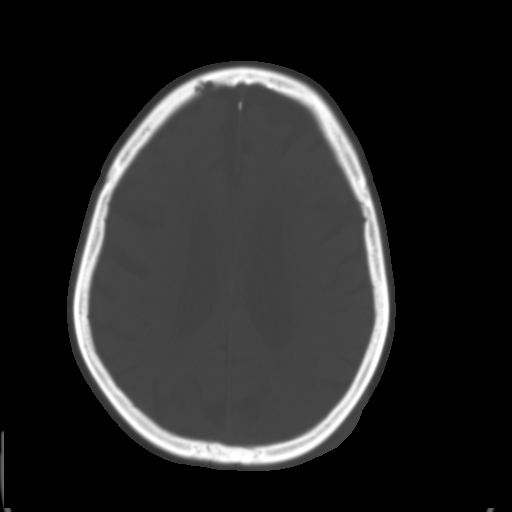
[im 22/31  brain]
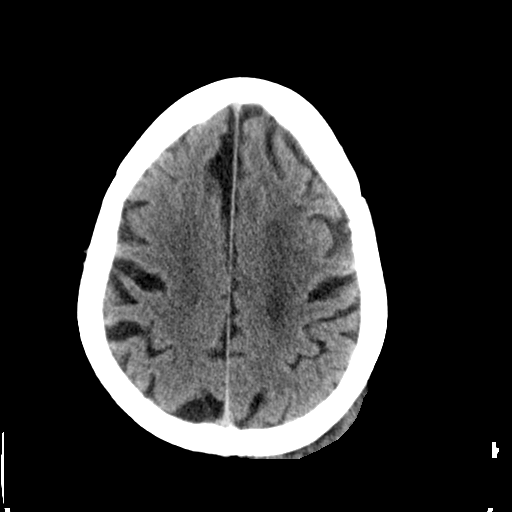
[im 24/31  brain]
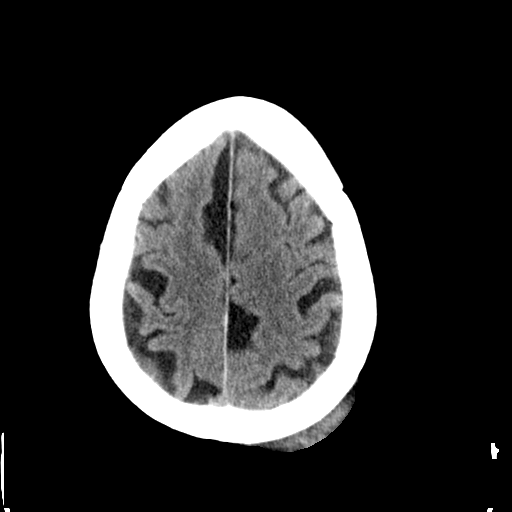
[im 26/31  brain]
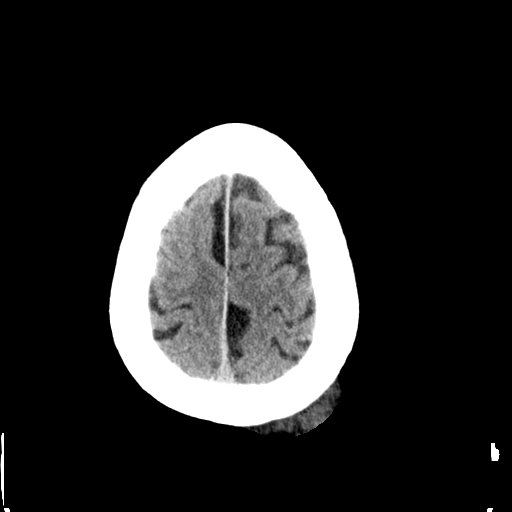
[im 28/31  brain]
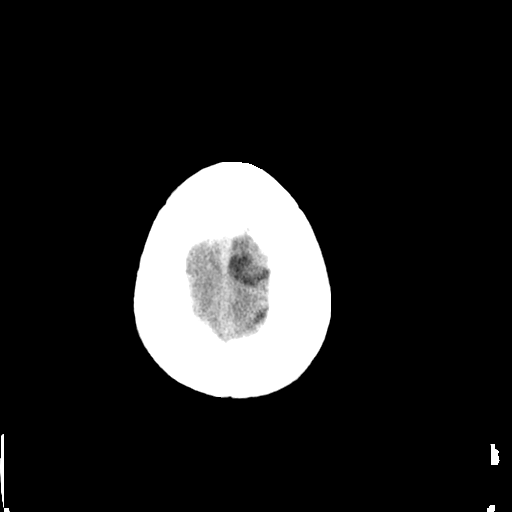
[im 28/31  bone]
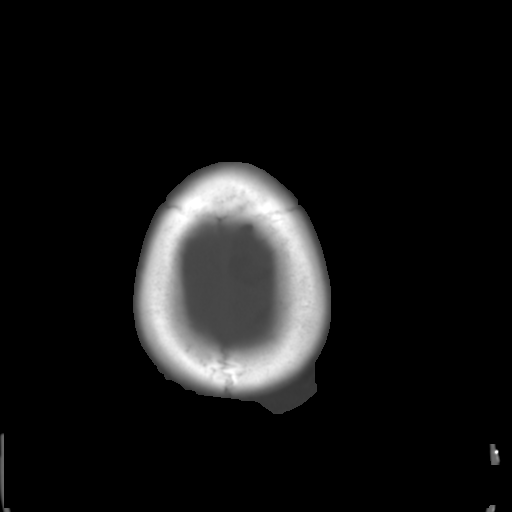

[Series 3: bone · axial · 0.45mm/px · z∈[-152,-112]mm · 3 of 31 slices shown]
[im 3/31  bone]
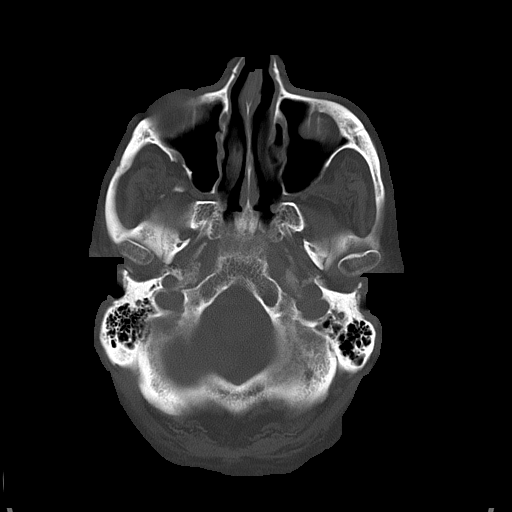
[im 7/31  bone]
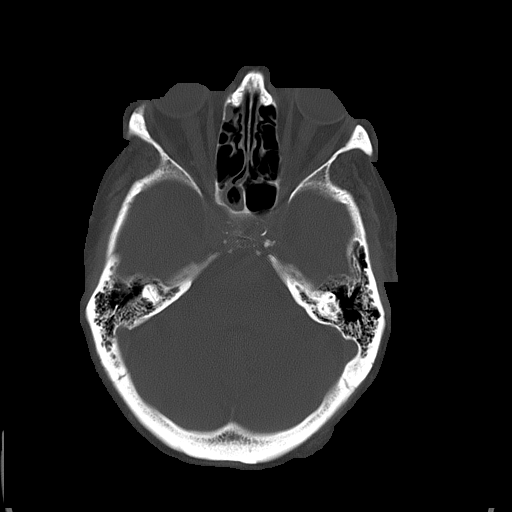
[im 11/31  bone]
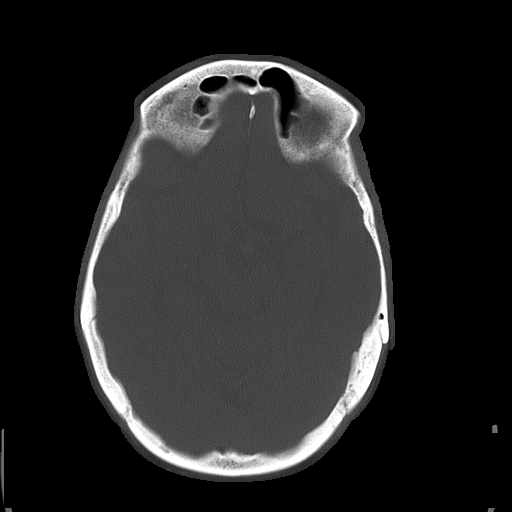

[16 of 30 positions shown; findings below may reference images not displayed]

FINDINGS: No mass. No hydrocephalus. No hemorrhage. White matter changes
consistent with chronic ischemia. Old basal ganglia lacunar infarcts noted.
Soft tissues swelling over the left parietal occipital region. No underlying
fracture noted. Opacification of the sphenoid sinus noted consistent
sinusitis.
IMPRESSION: Soft tissue swelling of the left posterior
parietal-occipital region. No underlying fracture. Chronic ischemic change.
Opacification of the sphenoid sinus consistent sphenoid sinusitis.

## 2015-01-04 NOTE — H&P (Signed)
PATIENT NAME:  Jared Medina, Jared Medina MR#:  [id number] DATE OF BIRTH:  July 27, 1931  DATE OF ADMISSION:  07/22/2012  PRIMARY CARE PHYSICIAN: Dr. Terance Hart at [facility]   CHIEF COMPLAINT: Confusion and increasing agitation and not his own self. History is obtained from ER physician and old records. Patient is currently lethargic and has severe dementia, unable to give any history or review of systems. No family members present.   HISTORY OF PRESENT ILLNESS: [name] is a 81 year old Caucasian gentleman who is a resident at [facility] who has multiple medical problems comes from the nursing home when staff noticed patient is not his usual self and decline in function. He was recently started on Levaquin for urinary tract infection and the cultures grew ESBL which is sensitive to Macrobid, imipenem, meropenem and cefoxitin. Patient was then put on Macrobid, however, he continued to show decline in his function, increased confusion along with agitation and was brought to the Emergency Room. Patient is being admitted for systemic inflammatory response syndrome secondary to failed outpatient treatment for ESBL producing Escherichia coli urinary tract infection. Patient had heart rate around 90 and has slowed down, is stable now at 78. He has a white count of 19,000. Patient received a dose of IV cefoxitin in the Emergency Room.   REVIEW OF SYSTEMS: Unable to obtain secondary to mental status changes.   PAST MEDICAL HISTORY: 1. Hypertension.  2. Hyperlipidemia.  3. Depression.  4. Benign prostatic hypertrophy.  5. Chronic obstructive pulmonary disease.  6. Dementia with history of psychosis in the past.  7. History of coronary artery disease, status post myocardial infarction in the past.  8. Abdominal aortic aneurysm.  9. Recurrent urinary tract infections.  10. Cerebrovascular accident status post CEA in the past.  11. Chronic obstructive pulmonary disease.  12. Gastroesophageal reflux.   13. Hypertension.   ALLERGIES: Aspirin, Cipro, Demerol, Macrobid, penicillin, sulfa, and Xalatan eye drops.   FAMILY HISTORY: From old records, mother died of gastric cancer, father died of meningitis.   SOCIAL HISTORY: Again from old records, patient is a resident at [facility]. He quit smoking about eight years ago. Denies any alcohol use.   HOME MEDICATIONS:  1. Advair 500/50, 1 puff b.i.d.  2. Albuterol inhaler 2 puffs every 3 hours as needed.  3. Atenolol 50 mg daily.  4. Avodart 0.5 mg daily.  5. Benadryl 25 mg every four hours as needed.  6. Cozaar 50 mg daily.  7. Diazepam 5 mg 4 times a day as needed for anxiety.  8. Lasix 40 mg daily.  9. Levaquin 500 mg p.o. daily for seven days.  10. Macrobid 100 mg p.o. b.i.d. for five days. This was started on 07/19/2012.   11. Nexium 40 mg daily.  12. Paxil 40 mg daily.  13. Plavix 75 mg daily.  14. Senna 8.6 mg b.i.d.  15. Simvastatin 40 mg p.o. daily.  16. Spiriva 18 mcg inhalation at bedtime.  17. Tamsulosin 0.4 mg at bedtime.  18. Trazodone 100 mg daily at bedtime.  19. Tylenol 2 tablets every six hours as needed.   PHYSICAL EXAMINATION:  GENERAL: Patient is an elderly 81 year old who is well dressed, appears clinically dehydrated.   VITAL SIGNS: He is afebrile at this time, pulse 78, it was one time on the monitor about 90, respirations 26 per minute, blood pressure 142/82, sats 93% on 2 liters.   HEENT: Atraumatic, normocephalic. Pupils are equal, round, and reactive to light and accommodation. Extraocular movements intact.  Oral mucosa is dry. Tongue appears coated. Poor oral hygiene.   NECK: Supple. No JVD, no carotid bruit.   RESPIRATORY: Expiratory mild wheezing present. No labored breathing or use of accessory muscles. No audible crackles.   CARDIOVASCULAR: Both the heart sounds are normal. Rate, rhythm regular. PMI not lateralized. Chest nontender.   EXTREMITIES: Good pedal pulses, good femoral pulses. No  lower extremity edema.   ABDOMEN: Soft, benign. There is no organomegaly noted.   NEUROLOGICAL: Unable to assess secondary to patient's mentation.   SKIN: Warm and dry.  LABORATORY, DIAGNOSTIC AND RADIOLOGICAL DATA: BUN 23, rest of the chemistry normal. White count 19.4, rest of the CBC within normal limits. Chest x-ray consistent with emphysema. No infiltrate noted. Urinalysis done on 07/19/2012 shows ESBL producing Escherichia coli which is sensitive to nitrofurantoin, gentamicin, imipenem, meropenem and cefoxitin.   ASSESSMENT: 81 year old [name] with multiple medical problems comes in with:  1. Systemic inflammatory response syndrome secondary to ESBL producing Escherichia coli found on urine culture November 2 at [facility]. Patient was on Levaquin, changed to p.o. nitroglycerin after urine culture results, failed outpatient treatment since he started having more confusion, agitation, and poor responsiveness. Patient received IV cefoxitin in the Emergency Room. His white count is 19,000, he transiently had tachycardia, however, now heart rate is back to 78, his respiration rate is 26. Will admit patient to medical floor. He is LIMITED CODE. Keep n.p.o. for now until patient is fully awake, able to take orally. Patient takes pureed diet with nectar thick liquids. Will follow blood cultures. Since urinalysis and urine culture was just done on 11/02 he did not repeat it.   2. History of recurrent urinary tract infections in the past.  3. Dementia with history of psychosis.  4. Hypertension. Appears to be stable. I will hold off on p.o. medications till patient awakens, fully alert to take orals medications.   5. Chronic obstructive pulmonary disease, mild wheezing. Sats are stable on oxygen. Will continue inhalers and breathing treatment as needed.  6. Deep vein thrombosis prophylaxis with TEDs and SCDs and Lovenox daily.  7. Further work-up according to patient's clinical course. There is  no family member present in the Emergency Room. Patient has  MOST form which indicates LIMITED CODE. No intubation or mechanical ventilation. Attempt cardiopulmonary resuscitation and okay with IV antibiotics and IV fluids.    TIME SPENT: 55 minutes.  ____________________________ Wylie HailSona A. Allena KatzPatel, MD sap:cms D: 07/22/2012 22:01:01 ET T: 07/23/2012 06:33:26 ET JOB#: [id number]  cc: Desiraye Rolfson A. Allena KatzPatel, MD, <Dictator> Teena Iraniavid M. Terance HartBronstein, MD Willow OraSONA A Jaizon Deroos MD ELECTRONICALLY SIGNED 07/29/2012 1:24

## 2015-01-04 NOTE — Op Note (Signed)
PATIENT NAME:  Jared Medina, Atul M MR#:  [id number] DATE OF BIRTH:  Feb 18, 1931  DATE OF PROCEDURE:  07/23/2012  PREOPERATIVE DIAGNOSES:   1. Extended-spectrum beta-lactamase Escherichia coli urinary tract infection with need for extended IV antibiotics.  2. Systemic antiinflammatory response syndrome. 3. Encephalopathy.  POSTOPERATIVE DIAGNOSES: 1. Extended-spectrum beta-lactamase Escherichia coli urinary tract infection with need for extended IV antibiotics.  2. Systemic antiinflammatory response syndrome. 3. Encephalopathy.  PROCEDURES:  1. Ultrasound guidance for vascular access to right brachial vein.  2. Fluoroscopic guidance for placement of catheter.  3. Insertion of peripherally inserted central venous catheter, right arm.  SURGEON: Festus BarrenJason Sicilia Killough, MD  ANESTHESIA: Local.   ESTIMATED BLOOD LOSS: Minimal.   INDICATION FOR PROCEDURE: This is a 81 year old white male with the above-mentioned problems. He needs a PICC line for extended IV antibiotics now and at discharge.  DESCRIPTION OF PROCEDURE: The patient's right arm was sterilely prepped and draped, and a sterile surgical field was created. The right brachial vein was accessed under direct ultrasound guidance without difficulty with a micropuncture needle and permanent image was recorded. 0.018 wire was then placed into the superior vena cava. Peel-away sheath was placed over the wire. A single lumen peripherally inserted central venous catheter was then placed over the wire and the wire and peel-away sheath were removed. The catheter tip was placed into the superior vena cava and was secured at the skin at 39 cm with a sterile dressing. The catheter withdrew blood well and flushed easily with heparinized saline. The patient tolerated procedure well. ____________________________ Annice NeedyJason S. Shemuel Harkleroad, MD jsd:slb D: 07/23/2012 16:48:57 ET T: 07/24/2012 10:10:13 ET JOB#: [id number]  cc: Annice NeedyJason S. Dhriti Fales, MD, <Dictator> Annice NeedyJASON S Federico Maiorino MD ELECTRONICALLY  SIGNED 07/24/2012 18:12

## 2015-01-04 NOTE — Discharge Summary (Signed)
PATIENT NAME:  Jared Medina, Jared Medina MR#:  [id number] DATE OF BIRTH:  1931/02/02  DATE OF ADMISSION:  07/22/2012 DATE OF DISCHARGE:  07/25/2012  PRIMARY CARE PHYSICIAN: Dr. Terance Hart  FINAL DIAGNOSES:  1. Systemic inflammatory response, extended-spectrum beta-lactamase Escherichia coli urinary tract infection.  2. Encephalopathy, underlying dementia.  3. History of cerebrovascular accident.  4. History of chronic obstructive pulmonary disease.  5. Benign prostatic hypertrophy.   PROCEDURE: PICC line placed.   GOAL: Comfort care, DO NOT RESUSCITATE. Hospice referral try to keep out of the hospital and comfortable.   CONDITION ON DISCHARGE: Guarded. Patient is a DO NOT RESUSCITATE.   MEDICATIONS ON DISCHARGE:  1. Nexium 40 mg daily.  2. Simvastatin 40 mg daily.  3. Tylenol 650 mg every six hours as needed for pain.  4. Avodart 0.5 mg daily.  5. Diazepam 5 mg 1 tablet 4 times a day as needed for anxiety and nervousness.  6. Paxil 40 mg daily.  7. Plavix 75 mg daily.  8. Senna 8.6 mg twice a day. 9. Flomax 0.4 mg at bedtime.  10. Xalatan 0.005% ophthalmic solution one drop each eye at bedtime.  11. Zyprexa 5 mg q.6 hours as needed.  12. DuoNeb nebulizer solution every six hours as needed for shortness of breath.  13. Ertapenem 1 gram IV once a day for 12 days, then stop.  14. Flush PICC line with normal saline 5 mL daily and heparin 50 units injection daily.  15. Stop taking Advair Diskus, ProAir, Lasix, albuterol CFC, atenolol, Benadryl, Cozaar, Levaquin, Macrobid, Spiriva and trazodone.   TREATMENTS: Flush PICC line daily. Remove PICC line after ertapenem course.   DIET: Puree honey thick liquids. No straws. Magic cup on lunch and dinner tray.   FOLLOW UP: Follow up in 1 to 2 days with doctor at facility, Dr. Terance Hart.   REFERRAL: Hospice.   LABORATORY, DIAGNOSTIC AND RADIOLOGICAL DATA: Blood cultures no growth 36 hours. Chest x-ray no evidence of cardiopulmonary disease. White  blood cell count 19.4, hemoglobin and hematocrit 14.5 and 42.5, platelet count 294, glucose 93, BUN 23, creatinine 0.72, sodium 142, potassium 4.8, chloride 106, CO2 26, calcium 9.6. Urinalysis actually just showed 1+ ketones. A previous urine culture was extended spectrum beta-lactamase Escherichia coli. CT scan of the head showed no evidence of acute ischemic or hemorrhagic infarction, diffuse cerebral and cerebellar atrophy and compensatory ventriculomegaly. Decreased density white matter both cerebral hemispheres consistent with chronic small vessel ischemia, old right caudate nucleus lacunae infarct. Repeat chest x-ray on the 8th showed no definite evidence of pneumonia. White count was up at 26.3.   HOSPITAL COURSE PER PROBLEM LIST:  1. For the systemic inflammatory response syndrome, ESBL Escherichia coli urinary tract infection, a PICC line was placed. Patient was started on ertapenem, will complete a 14 day course of ertapenem then remove the PICC line. As per the wife the patient has an underlying bladder mass. I do not think the patient will be able to tolerate any urological procedure.  2. Encephalopathy and underlying dementia, worsened with urinary tract infection. Patient is able to follow some simple commands.  3. History of cerebrovascular accident. On Plavix.  4. History of chronic obstructive pulmonary disease. Unable to do the inhalers, will just give nebulizer p.r.n.  5. Benign prostatic hypertrophy on medications for enlarged prostate.  6. I did have a long conversation on the phone with the wife. I did make the patient a DO NOT RESUSCITATE. I do recommend a hospice referral on to  try to keep the patient out of the hospital and comfortable. I think the patient's overall prognosis is poor, steadily declining. I do not think he will keep up with his nutritional needs. I spoke with the wife. The patient does not want a feeding tube. I think he is at the end stage of life especially if he  cannot keep up with his nutritional needs. A hospice referral is needed here to keep the patient out of the hospital and comfortable. Would continue the course of antibiotics and see how he responds.   TIME SPENT ON DISCHARGE: 35 minutes.   ____________________________ Herschell Dimesichard J. Renae GlossWieting, MD rjw:cms D: 07/25/2012 09:55:21 ET T: 07/25/2012 10:14:12 ET JOB#: [id number]  cc: Herschell Dimesichard J. Renae GlossWieting, MD, <Dictator> Teena Iraniavid M. Terance HartBronstein, MD Salley ScarletICHARD J Brianni Manthe MD ELECTRONICALLY SIGNED 07/26/2012 20:21

## 2015-01-09 NOTE — H&P (Signed)
PATIENT NAME:  Jared Medina, Jared Medina MR#:  [id number] DATE OF BIRTH:  [birth year]  DATE OF ADMISSION:  12/24/2011  PRIMARY CARE PHYSICIAN: Dr. Elizabeth Sauer  REQUESTING PHYSICIAN: Dr. Clemens Catholic   CHIEF COMPLAINT: Altered mental status.   HISTORY OF PRESENT ILLNESS: Patient is a 81 year old male with a known history of cerebrovascular accident, severe peripheral vascular disease, gastroesophageal reflux disease is being admitted for altered mental status. Patient's wife called EMS early this morning as patient was acting weird all night, was walking up and down his home and when she tried to wake him up he was completely out of it this morning. He was also talking too much when he woke up and this was not his usual behavior. EMS brought him to the Emergency Department. While in the ED he was also hallucinating and is being admitted for further evaluation and management.   PAST MEDICAL HISTORY:  1. Severe peripheral vascular disease, status post arterial surgery to both lower extremities.  2. Coronary artery disease, status post myocardial infarction. 3. Recurrent urinary tract infections.  4. History of cerebrovascular accident x3, status post carotid endarterectomy.  5. Chronic obstructive pulmonary disease.  6. Gastroesophageal reflux disease. 7. Hypertension.  8. Obesity.  9. Early glaucoma. 10. Sinusitis.   PAST SURGICAL HISTORY:  1. Neck surgery.  2. Back surgery. 3. Appendectomy. 4. CEA.    FAMILY HISTORY: Mother with stomach cancer. Father died of meningitis.   SOCIAL HISTORY: Quit smoking eight years ago. No alcohol use. He is married, lives with his wife.   ALLERGIES: Aspirin, Cipro, Demerol, Macrobid, penicillin, sulfa, and Xalatan.   REVIEW OF SYSTEMS: CONSTITUTIONAL: No fever, fatigue, weakness. EYES: No blurred or double vision. ENT: No tinnitus, ear pain. RESPIRATORY: No cough, wheezing, hemoptysis. CARDIOVASCULAR: No chest pain, orthopnea, edema. GASTROINTESTINAL: No nausea,  vomiting, diarrhea. GENITOURINARY: No dysuria, hematuria. ENDOCRINE: No polyuria or nocturia. HEMATOLOGY: No anemia, easy bruising. SKIN: No rash or lesion. He has dry skin with some eczematous rashes all over his body. NEUROLOGIC: No tingling, numbness or weakness. Some mental status changes and hallucinations. PSYCHIATRIC: Patient is alert now but is still confused. No history of anxiety, depression.   MEDICATIONS AT HOME:  1. Advair 250/50, 1 puff b.i.d.  2. Atenolol 50 mg p.o. daily. 3. Avodart 0.5 mg p.o. daily. 4. Paroxetine 40 mg p.o. at bedtime.  5. Plavix 75 mg p.o. at bedtime. 6. ProAir 2 puffs inhaled q.6 hours as needed.  7. Simvastatin 40 mg p.o. daily. 8. Spiriva once daily.  9. Tamsulosin 0.4 mg p.o. at bedtime.   PHYSICAL EXAMINATION:  VITAL SIGNS: Temperature 98.1, heart rate 72 per minute, respirations 20 per minute, blood pressure 146/76 mmHg. He is saturating 94% on room air.   GENERAL: Patient is a 81 year old male lying in the bed comfortably without any acute distress. He is trying to get out of the bed.   EYES: Equal, round, reactive to light and accommodation. No scleral icterus. Extraocular muscles intact.   HENT: Head atraumatic, normocephalic. Oropharynx and nasopharynx clear.   NECK: Supple. No jugular venous distention. No thyroid enlargement or tenderness.   LUNGS: Clear to auscultation bilaterally. No wheezes, rales, rhonchi, crepitation.   CARDIOVASCULAR: S1, S2 normal. No murmurs, rubs, or gallops.   ABDOMEN: Soft, nontender, nondistended. Bowel sounds present. No organomegaly or mass.   EXTREMITIES: No pedal edema, cyanosis, clubbing.   NEUROLOGIC: Nonfocal examination. He is moving all his extremities against gravity involuntarily. Sensation intact. Cranial nerves II through XII intact.  PSYCHIATRIC: Patient is alert, seems confused and oriented x1, maybe 2.   SKIN: Has some eczematous rashes all over his body.   LABORATORY, DIAGNOSTIC, AND  RADIOLOGICAL DATA: Normal BMP except creatinine 1.31. Normal liver function tests. Normal first set of cardiac enzymes. Normal CBC except white count of 11.6. Negative urinalysis.   He is saturating 93% on room air.   CT scan of the head without contrast showed no acute intracranial process. Chronic small vessel ischemic disease.   Chest x-ray showed no acute cardiopulmonary disease. Fibrosis present.   Bilateral carotid Doppler's on 04/08 showed no hemodynamically significant stenosis. High-grade stenosis of the left internal carotid noted on prior CT is no longer seen. Antegrade flow in both vertebrals.   IMPRESSION AND PLAN:  1. Altered mental status/encephalopathy, likely metabolic. It seems more acute delirium with some visual hallucinations. Will involve psychiatry and start him on Haldol for time being as he was still hallucinating while in the hospital. He seemed to have responded to Haldol fairly well. He has been calmed down. Will monitor. Still waiting for MRI of the brain to evaluate for any neurological issues. Will get physical therapy evaluation.  2. Acute renal insufficiency. Will hydrate him, likely prerenal. Will recheck his creatinine in the morning.  3. Chronic obstructive pulmonary disease. Will continue Advair, Spiriva and monitor his oxygen levels. He is already saturating 93% on room air  4. Hypertension. Will continue atenolol and monitor his blood pressure.   TOTAL TIME TAKING CARE OF THIS PATIENT: 45 minutes.   CODE STATUS: FULL CODE.   ____________________________ Tamya Denardo S. Sherryll BurgerShah, MD vss:cms D: 12/24/2011 16:34:15 ET T: 12/24/2011 16:55:47 ET JOB#: [id number]  cc: Rayvon Brandvold S. Sherryll BurgerShah, MD, <Dictator> Duanne Limerickeanna C. Jones, MD Jolanta B. Jennet MaduroPucilowska, MD Patricia PesaVIPUL S Le Faulcon MD ELECTRONICALLY SIGNED 12/25/2011 9:07

## 2015-01-09 NOTE — Discharge Summary (Signed)
PATIENT NAME:  Jared Medina, Jared Medina MR#:  [id number] DATE OF BIRTH:  1930/12/20  DATE OF ADMISSION:  01/06/2012 DATE OF DISCHARGE:  01/09/2012  ADMITTING DIAGNOSIS: Delirium.  DISCHARGE DIAGNOSES:  1. Delirium due to dehydration and acute renal failure as well as urinary tract infection. 2. Dehydration.  3. Acute renal failure. 4. Urinary tract infection, Enterococcus faecalis.  5. Oral candidiasis, resolved. 6. Generalized weakness. 7. History of coronary artery disease status post myocardial infarction in the past.  8. History of abdominal aortic aneurysm. 9. History of benign prostatic hypertrophy. 10. Cerebrovascular accident x3 status post CEA in the past. 11. History of chronic obstructive pulmonary disease, gastroesophageal reflux disease, and hypertension as well as peripheral vascular disease, all stable.   DISCHARGE CONDITION: Stable.   DISCHARGE MEDICATIONS: The patient is to resume his outpatient medications. 1. Advair Diskus 250/50 one puff twice daily.  2. Atenolol 50 mg p.o. daily.  3. Avodart 0.5 mg p.o. daily.  4. Paroxetine 40 mg p.o. daily.  5. Plavix 75 mg p.o. daily.  6. Simvastatin 40 mg p.o. daily.  7. Tamsulosin 0.5 mg p.o. at bedtime.  8. Spiriva 18 mcg capsule inhalation daily.  9. ProAir HFA 2 puffs every six hours as needed.  10. Nexium 40 mg p.o. daily.  11. Cozaar 50 mg p.o. daily.  12. Xalatan 0.005% ophthalmic solution one drop to each affected eye every six hours.  13. Olanzapine 2.5 mg p.o. every six hours.   ADDITIONAL MEDICATIONS:  1. Nitrofurantoin microcrystals 100 mg p.o. twice daily with meals for 10 days to complete course.  2. Tylenol 650 mg p.o. every six hours as needed.  3. Lasix 40 mg p.o. daily as needed. This is a new schedule. In the past, the patient was on Lasix 40 mg p.o. daily around-the-clock.   HOME OXYGEN: Portable tank with 2 liters of oxygen through nasal cannula to keep pulse oximetry at around 92% and above. Wean as  tolerated to room air.  DIET: 2 grams salt, mechanical soft, low fat, low cholesterol.   ACTIVITY LIMITATIONS: As tolerated.   REFERRALS: Physical therapy 2 to 7 times a week.  DISCHARGE FOLLOWUP: Follow-up with Dr. Elizabeth Sauer in two days after discharge.  CONSULTANTS: Care Management.   RADIOLOGIC STUDIES: Chest portable single view on 01/06/2012 revealed emphysematous as well as fibrotic changes within the lungs, particularly right hemithorax. Atelectasis versus infiltrate within left lung base. No focal regions of consolidation were noted.   CT scan of the head without contrast on 01/06/2012 revealed no evidence of acute intracranial abnormalities, involutional changes.  HISTORY OF PRESENT ILLNESS: The patient is a 81 year old Caucasian male with past medical history significant for history of coronary artery disease, hypertension, and hyperlipidemia who presented to the hospital with complaints of weakness. He apparently was okay until the day of admission when he became very weak in his lower extremities and he was not able to get up from the bed. He also became acutely confused and had visual hallucinations on arrival to the emergency room. Please refer to Dr. Ulyses Amor admission note on 01/06/2012.  On arrival to the emergency room, the patient's temperature was 97.5, respiration rate 18, pulse 77, blood pressure 161/72, and saturation was 94% on oxygen therapy. General examination was unremarkable.   The patient's EKG showed normal sinus rhythm at 64 beats per minute, right bundle branch block, T wave abnormality, consider lateral ischemia, according to EKG criteria. However, nonspecific T wave abnormality seemed to be improved since prior  exam.   The patient's lab data showed BUN and creatinine of 19 and 1.70 and glucose 102, otherwise BMP was unremarkable. Estimated GFR for now African American was 38. Liver enzymes were normal. Cardiac enzymes, first set negative. White  blood cell count was elevated to 12.3, hemoglobin 14.3, platelet count 230, and absolute neutrophil count 9.3.   Blood cultures x1 taken in the emergency room, 01/06/2012, showed no growth.   Urinalysis revealed yellow cloudy urine, negative for glucose, bilirubin or ketones, specific gravity 1.023, pH 5.0, negative for blood, protein and nitrites, trace leukocyte esterase, 2 red blood cells, 27 white blood cells, 3+ bacteria, and 19 epithelial cells, as well as mucus was present.   Urine cultures grew Enterococcus faecalis, more than 100,000 colony-forming units, resistant to tetracycline and ciprofloxacin as well as levofloxacin, however sensitive to nitrofurantoin and ampicillin as well as linezolid.   HOSPITAL COURSE: As soon as the patient's culture identification came back, the patient was switched to nitrofurantoin. With nitrofurantoin, despite his claims of sensitivity to nitrofurantoin with nausea and vomiting, and because of no alternative, as the patient is allergic also to ampicillin, linezolid would not be the best medication to treat his urinary tract infection. He was able to take oral nitrofurantoin, however, the patient is to be followed very closely and nitrofurantoin may be able to be changed to Augmentin or similar medication such as ampicillin if he develops nausea and vomiting with nitrofurantoin. His dehydration resolved with IV fluids and as soon as his dehydration resolved the patient's mentation became clear signifying that in fact it was dehydration but not urinary tract infection which was the culprit of the patient's acute alteration of his mental status, delirium. The patient was treated with olanzapine with improvement of his mental status. He was also given IV fluids as mentioned above with improvement of his mental status. His kidney function resolved. On 01/07/2012, the patient's BUN and creatinine were 13 and 1.26. From then on his IV fluids were discontinued and the  patient was followed in regards to his oral intake, which remained very stable, which was approximately 100% of offered meals.   The patient was noted to have oral candidiasis and was given nystatin as needed and his oral candidiasis resolved.   For generalized weakness, he was evaluated by a physical therapist who recommended skilled nursing facility placement. The patient's family also was interested in skilled nursing facility placement.  In regards to coronary artery disease, abdominal aortic aneurysm, CVA, chronic obstructive pulmonary disease, gastroesophageal reflux disease, hypertension, and peripheral vascular disease no changes were made in regards to that. However, the patient was taken off Lasix around the clock. He was offered to have his schedule changed to as needed dosing.   The patient is being discharged in stable condition with the above-mentioned medications and follow-up. His vital signs on day of discharge are temperature 97.7, pulse 72, respiration rate 20, blood pressure 108/68, and saturation 92% on 2 liters of oxygen through nasal cannula.   In regards to hypoxia, the patient was noted to somewhat hypoxic in the hospital. It was felt to be due to chronic obstructive pulmonary disease. The patient was also given Levaquin. However, there was not confirmed inflammation in his lungs. The patient had no symptoms, no cough, no sputum production, and no fevers. For this reason, Levaquin was discontinued completely when urine cultures came back positive for Enterococcus faecalis. It is recommended to follow the patient's respiratory status very closely and wean him off oxygen  and he is able to tolerate. If he develops symptoms, it is recommended to recheck his chest x-ray to ensure that he does not have pneumonia. He is being discharged in stable condition with the above medications and follow-up.   TIME SPENT: 40 minutes. ____________________________ Katharina Caper,  MD rv:slb D: 01/09/2012 11:21:30 ET     T: 01/09/2012 11:45:22 ET        JOB#: [id number] cc: Katharina Caper, MD, <Dictator> Duanne Limerick, MD Tavi Hoogendoorn MD ELECTRONICALLY SIGNED 01/10/2012 20:19

## 2015-01-09 NOTE — Discharge Summary (Signed)
PATIENT NAME:  Jared Medina, Jared Medina MR#:  [id number] DATE OF BIRTH:  11-Jan-1931  DATE OF ADMISSION:  12/24/2011 DATE OF DISCHARGE:  12/25/2011  DISCHARGE DIAGNOSES:  1. Encephalopathy, likely due to mild cognitive decline. Haldol seemed to help. All neurological work-up was negative. Physical therapy is recommending short-term rehab versus skilled nursing facility, but the patient and his wife are refusing same claiming his mood issue to be chronic and prefer for him to go home as they have been managing him at home okay.  2. Acute renal insufficiency, likely prerenal and resolved with hydration.   SECONDARY DIAGNOSES:  1. Severe peripheral vascular disease. 2. Coronary artery disease. 3. Recurrent urinary tract infection.  4. History of cerebrovascular accident x3. 5. Chronic obstructive pulmonary disease.  6. Gastroesophageal reflux disease.  7. Hypertension.  8. Obesity.  9. Sinusitis.   CONSULTANTS: 1. Physical Therapy. 2. Kristine Linea, MD - Psychiatry.  PROCEDURES/RADIOLOGY:  CT scan of the head without contrast on 12/24/2011 showed no acute intracranial process. Chronic small vessel ischemic disease.   Chest x-ray on 12/24/2011 showed no acute changes. Fibrosis or atelectasis at the left base.   MRI of the brain without contrast on 12/24/2011 showed moderate to severe involutional changes without evidence of acute abnormality.   Bilateral carotid Doppler's on 12/24/2011 showed no hemodynamically significant stenosis on either side. High-grade stenosis of left internal carotid noted. Prior CTA is no longer seen consistent with internal left endarterectomy. Antegrade flow in both vertebrals.  MAJOR LABORATORY PANEL: Urinalysis on admission was negative.   Blood cultures x2 were negative.   HISTORY AND SHORT HOSPITAL COURSE: The patient is a 81 year old male with above-mentioned medical problems who was admitted for encephalopathy thought to be possibly metabolic in nature. He  was found to be hallucinating. Please see Dr. Margaretmary Eddy dictated history and physical on 12/24/2011 for further details. Psychiatry consult was obtained with Dr. Jennet Maduro who recommended starting Haldol as needed and scheduled dose. The patient seemed to benefit significantly from starting Haldol and his hallucinations were under much better control. He was evaluated by physical therapy and was recommended short-term rehab, although the patient and family refused. His mental status was close to his baseline and he is being discharged home in stable condition. His family does not want him to go to any kind of rehab facility. He also had some acute renal insufficiency with a creatinine of 1.31 on admission, which was resolved with hydration. He had negative one set of cardiac enzymes and had a leukocytosis on admission which was resolved with hydration. On 12/25/2011 he was close to baseline and was discharged home in stable condition.   DISCHARGE PHYSICAL EXAMINATION:  VITALS: On the day of discharge, his temperature was 98.5, heart rate 72 per minute, respirations 20 per minute, blood pressure 120/68 mmHg, and he was saturating 94% on room air.   HEART:  S1 and S2 normal. No murmurs, rubs, or gallops.   LUNGS: Clear to auscultation bilaterally. No wheezing, rales, rhonchi, or crepitation.   ABDOMEN: Soft and benign.   NEUROLOGIC: Nonfocal examination. All other physical examination remained at the base.   DISCHARGE MEDICATIONS:  1. Advair 250/50 one puff twice a day. 2. Atenolol 50 mg p.o. daily.  3. Avodart 0.5 mg p.o. daily.  4. Paroxetine 40 mg p.o. at bedtime.  5. Plavix 75 mg p.o. at bedtime.  6. Simvastatin 40 mg p.o. daily. 7. Tamsulosin 0.4 mg p.o. daily.  8. Spiriva once daily. 9. ProAir 2 puffs inhaled every  six hours as needed.  10. Haldol 0.5 mg p.o. three times daily and 0.5 mg p.o. every eight hours as needed.   DISCHARGE DIET: Low-sodium.   DISCHARGE ACTIVITY: As tolerated.    DISCHARGE INSTRUCTIONS AND FOLLOW-UP: The patient was instructed to follow-up with his primary care physician, Dr. Elizabeth Sauereanna Jones, in 1 to 2 weeks. [facility] to 3 [facility]. He was set up to get home health physical therapy and nursing.  TOTAL TIME DISCHARGING THIS PATIENT: 45 minutes.  ____________________________ Ellamae SiaVipul S. Sherryll BurgerShah, MD vss:slb D: 12/25/2011 21:32:07 ET T: 12/26/2011 10:24:04 ET JOB#: [id number]  cc: Grecia Lynk S. Sherryll BurgerShah, MD, <Dictator> Duanne Limerickeanna C. Jones, MD Jolanta B. Jennet MaduroPucilowska, MD [facility] Ellamae SiaVIPUL S [facility] MD ELECTRONICALLY SIGNED 12/26/2011 17:12

## 2015-01-09 NOTE — Consult Note (Signed)
PATIENT NAME:  YOSGART, PAVEY MR#:  [id number] DATE OF BIRTH:  08/23/1931  DATE OF CONSULTATION:  12/24/2011  REFERRING PHYSICIAN:  Delfino Lovett, MD  CONSULTING PHYSICIAN:  Nyla Creason B. Tyre Beaver, MD  REASON FOR CONSULTATION: To evaluate a patient with encephalopathy.   IDENTIFYING DATA: Mr. Redner is a 81 year old male with no psychiatric history.   CHIEF COMPLAINT: " I feel good."   HISTORY OF PRESENT ILLNESS: Mr. Bontempo has a history of mild cognitive decline. Per his wife's report, he had several episodes of altered mental status and hallucinations with bizarre behaviors during the course of a urinary tract infection. On the night prior to admission the patient was behaving strangely, walking around the house, difficult to redirect, and hallucinating. He was seeing people in his room.  Hallucinations continued during his visit to the Emergency Room. The patient was admitted to the medical floor for encephalopathy. We will ask to evaluate for agitation and hallucinations. The patient reportedly was agitated and unruly earlier during the day. By the time I came to see him he received 2.5 mg of Haldol and his behavior has much improved. He is pleasant, polite, and cooperative. There is no agitation, no inappropriate behavior, no evidence of psychotic symptoms. The patient denies any symptoms of depression and anxiety. He wants to go back home with his wife. He is uncertain why he was admitted to the hospital. There is reportedly no history of alcohol or illicit drug use.   PAST PSYCHIATRIC HISTORY: None until mild cognitive decline several years ago that is at times accompanied by altered mental status due to urinary tract infection delirium.   There were no hospitalizations. No suicide attempts.   FAMILY PSYCHIATRIC HISTORY: None reported.   PAST MEDICAL HISTORY:  1. Peripheral vascular disease. 2. Coronary artery disease.  3. Recurrent urinary tract infection 4. History of cerebrovascular  accidents times three.   5. Chronic obstructive pulmonary disease.  6. Gastroesophageal reflux disease.  7. Hypertension.  8. Obesity.  9. Glaucoma. 10. Sinusitis.   ALLERGIES: Aspirin, Cipro, Demerol, Macrobid, penicillin, sulfa drugs, Xalatan.   MEDICATIONS ON ADMISSION:  1. Advair 250/50 twice daily.  2. Atenolol 50 mg daily. 3. Avodart 0.5 mg daily.  4. Paroxetine 40 mg at bedtime.  5. Plavix 75 mg at bedtime. 6. ProAir 2 puffs every six hours.  7. Simvastatin 40 mg daily.  8. Spiriva once daily.  9. Tamsulosin 0.5 mg at bedtime.   MEDICATIONS IN THE HOSPITAL:  1. Aspirin 325 mg daily.  2. Tenormin 50 mg daily. 3. Avodart 0.5 mg daily.  4. Advair Diskus 250/50 twice daily.  5. Haldol 1 to 2 mg IV push as needed.  6. Paxil 40 mg daily.  7. Zocor 40 mg daily.  8. Flomax 0.4 mg daily.   9. Spiriva daily.   SOCIAL HISTORY: He is retired and lives with his wife. He quit smoking eight years ago.   REVIEW OF SYSTEMS:  Not  easy to obtain. CONSTITUTIONAL: No fevers or chills. No weight changes. EYES: Positive for glaucoma. ENT: No hearing loss. RESPIRATORY: Positive for occasional shortness of breath. CARDIOVASCULAR: No chest pain. GASTROINTESTINAL: No abdominal pain, nausea, vomiting, or diarrhea. GU: No incontinence or frequency. Positive for recurrent UTIs. ENDOCRINE: No heat or cold intolerance. LYMPHATIC: No anemia or easy bruising. INTEGUMENT: No acne or rash.  MUSCULOSKELETAL: No muscle or joint pain. NEUROLOGIC: No tingling or weakness.  PSYCHIATRIC: See history of present illness for details.   PHYSICAL EXAMINATION:  VITAL SIGNS:  Blood pressure 146/76, pulse 72, respirations 22, temperature 98.1.   GENERAL: This is a slightly obese, elderly gentleman in no acute distress. The rest of the physical examination is deferred to his primary attending.   LABORATORY, DIAGNOSTIC, AND RADIOLOGICAL DATA: Chemistries within normal limits except for blood glucose of 109 and  creatinine 1.31 on admission that normalized. Blood alcohol level was not done. LFTs are within normal limits. CBC: Mild anemia. Urinalysis is not suggestive of urinary tract infection. Blood culture negative.   MENTAL STATUS EXAMINATION: The patient is alert and oriented to person, place, and somewhat to situation. He is completely wrong on the date, thinking that it is 2003. He is pleasant, polite, and cooperative. He is wearing a hospital gown. He maintains good eye contact. His speech is soft. Mood is fine with flat affect. Thought processing is slow. He denies suicidal or homicidal ideation. There is no evidence of delusions or paranoia. He denies hallucinations at the time of assessment, but was admitted with visual hallucinations. His cognition is impaired. He is unable to participate in the cognitive part of the examination. His insight and judgment are poor.   SUICIDE RISK ASSESSMENT: This is a patient with cognitive decline and multiple medical problems who was admitted for delirium secondary to medical condition. He is unable to plan or execute a suicide attempt, but requires supervision and support.   AXIS I: Cognitive disorder, not otherwise specified.  Delirium secondary to medical problems or medications.   AXIS II: Deferred.   AXIS III: Peripheral vascular disease, coronary artery disease, urinary tract infection, cerebrovascular accident,  chronic obstructive pulmonary disease, gastroesophageal reflux disease, hypertension, obesity, glaucoma.   AXIS IV: Multiple medical problems, cognitive decline.   AXIS V: GAF 40.    PLAN:   1. Please continue low dose p.r.n. Haldol as needed for agitation as it was helpful.  2. We will treat aggressively any underlying medical condition.  3. Please continue Paxil. 4. I will initiate low dose of Haldol for delirium. This may be discontinued upon discharge.  5. I will follow up.    ____________________________ Braulio ConteJolanta B. Jennet MaduroPucilowska,  MD jbp:bjt D: 12/25/2011 20:14:00 ET T: 12/26/2011 09:29:36 ET JOB#: [id number]  cc: Briell Paulette B. Jennet MaduroPucilowska, MD, <Dictator> Shari ProwsJOLANTA B Jaclynn Laumann MD ELECTRONICALLY SIGNED 12/28/2011 11:22

## 2015-01-09 NOTE — Consult Note (Signed)
Brief Consult Note: Diagnosis: Cognitive disorder NOS, encephalopathy.   Patient was seen by consultant.   Consult note dictated.   Recommend further assessment or treatment.   Orders entered.   Comments: [name] has no pasychiatric past except for mild cognitive decline. In the past he had several episodes of UTI-induced delirium. He was brought to the hospital for "strange" behavior with visual hallucinations suggestive of delirium although the cause of it is unclear at present. He was agitated earlier today but responded well to a dose of Haldol.  PLAN: 1. Will start low dose standing haldol for delirium.  2. Please continue prn Haldol for agitation.  3. I will follow up. 1. Will start low dose.  Electronic Signatures: Kristine LineaPucilowska, Jolanta (MD)  (Signed 08-Apr-13 16:05)  Authored: Brief Consult Note   Last Updated: 08-Apr-13 16:05 by Kristine LineaPucilowska, Jolanta (MD)

## 2015-01-09 NOTE — H&P (Signed)
PATIENT NAME:  Jared Medina, Jared Medina MR#:  [id number] DATE OF BIRTH:  March 05, 1931  DATE OF ADMISSION:  01/06/2012  PRIMARY CARE PHYSICIAN: Dr. Yetta BarreJones.   CHIEF COMPLAINT: Weakness.   HISTORY OF PRESENT ILLNESS: [name] was in his usual state of health until this morning when he complained to his wife that his legs were too weak to get out of bed. She therefore brought him to the Emergency Department, upon arrival to the ED he was mentating well, but shortly thereafter became acutely confused and was having visual hallucinations. This has been a problem for him in the past, for which he was admitted to [facility] for a [facility] and started on olanzapine. His wife states that he has been on olanzapine for over a week and his symptoms of visual hallucinations improved. Without any treatment his mental status cleared up today and he and his wife both stated that he should go home. He described a wheelchair ramp going into his house and a wheelchair at home with a walker as well. They were both in agreement with this plan. Unfortunately, upon arrival at home he got out of the car and promptly fell hitting his back. This prompted his wife to bring him back to the Emergency Department. On the way back he developed chills and became confused again. It is unclear whether or not he had a dose of olanzapine while he was in route to home. Notably, he was diagnosed with a urinary tract infection on his first visit to the Emergency Department today. He has a history of chronic urinary tract infections with benign prostatic hypertrophy. He has multiple antibiotic allergies and was started on Keflex. He did receive one dose of p.o. Keflex prior to leaving the Emergency Department. Since coming back, he has had vomiting, which is his typical reaction to penicillins. Otherwise, he denies any dysuria, hematuria, urinary urgency, or frequency. He does have chills, but denies any fevers.   REVIEW OF SYSTEMS: CONSTITUTIONAL: Overall he  denies any weight loss or weight gain but does have weakness and fatigue as described in the history of present illness. EYES: No diplopia, blurry vision, eye pain, or floaters. ENT: No rhinorrhea, epistaxis, or nasal discharge. No tinnitus or hearing changes. No dysphagia, odynophagia. RESPIRATORY: No wheezing, cough, or dyspnea. No pleuritic chest pain. CARDIOVASCULAR: No chest pain, tachycardia, palpitations. No orthopnea, or PND. GASTROINTESTINAL: No diarrhea or constipation. Again he does have nausea and vomiting. No melena, hematochezia, or hematemesis. GENITOURINARY: No dysuria, hematuria, nocturia, or incontinence. MUSCULOSKELETAL: No joint pain, stiffness, redness, or swelling. SKIN: No rashes, wounds, pruritus or other skin changes. NEURO: No headaches, weakness, numbness or tingling. ENDOCRINE: No heat or cold intolerance. No polyuria, polydipsia, or polyphagia. HEME: No easy bruising, easy bleeding, anemia, or swollen glands.   PAST MEDICAL HISTORY:  1. Coronary artery disease with a myocardial infarction in the past.  2. Abdominal aortic aneurysm. 3. Urinary tract infection, which has been a chronic issue for him with benign prostatic hypertrophy. 4. Cerebrovascular accident x3, status post CEA.  5. Chronic obstructive pulmonary disease. 6. Gastroesophageal reflux disease. 7. Hypertension. 8. C3, C4 fracture with chronic back pain status post back surgery.  9. Peripheral artery disease.   FAMILY HISTORY: Mother died of gastric cancer. Father died of meningitis.   SOCIAL HISTORY: Quit smoking tobacco eight years ago. No alcohol. He lives with his wife. CODE STATUS: He is a FULL CODE. His wife is his POA and contact person.   ALLERGIES: Aspirin, ciprofloxacin, Demerol,  Macrobid, penicillin, sulfa, and Xalatan  HOME MEDICATIONS:  1. Flomax 0.4 mg p.o. at bedtime.  2. Spiriva 1 capsule daily. 3. Simvastatin 40 mg daily. 4. ProAir p.r.n.  5. Plavix 75 mg p.o. daily.  6. Paroxetine 40  mg p.o. daily.  7. Avodart 0.5 mg p.o. daily.  8. Atenolol 50 mg p.o. daily.  9. Advair 250/50 mg inhaled b.i.d.  10. Olanzapine 2.5 mg q.4-6 hours p.r.n. visual hallucinations. 11. Cozaar 50 mg p.o. daily.  12. Lasix 40 mg p.o. daily.  13. Xalatan eyedrops 0.005% one drop to each eye at bedtime.    PHYSICAL EXAMINATION:  VITAL SIGNS: Vital signs on admission: Temperature 97.5, respiratory rate 18, heart rate 77, blood pressure 161/72, oxygen saturation is 94% on room air.   GENERAL: He is well developed and well nourished and resting comfortably.   HEENT: Conjunctivae and lids are without erythema or pallor. Pupils are equally round and reactive to light and accommodation External inspection of the ears and nose are without lesions or masses. Hearing is intact to whispered voice. Nasal mucosa, septum, and turbinates are without lesions or masses. Lips, teeth, and gums are without lesions or masses. Oropharynx is clear. Oral mucosa is moist. Posterior pharynx is free of exudate.   NECK: Supple with no masses. Thyroid is not enlarged. There are no nodules.   RESPIRATORY: He is not in respiratory distress. No intercostal retractions or use of accessory muscles. Lungs are clear throughout. No rales, rhonchi or wheezing.   HEART: Regular. No murmurs, rubs, or gallops. Pedal pulses are 2+ and equal bilaterally.   ABDOMEN: Soft, nontender, nondistended. Liver and spleen are not enlarged. He has normoactive bowel sounds. He has no cervical or axillary lymphadenopathy. No clubbing, cyanosis or edema. He has full range of motion with good strength and tone in all four extremities. Ambulation is in unsteady, but better with a walker.   SKIN: Skin and subcutaneous tissue are significant for bruising throughout. Palpation of skin reveals no induration or nodules.   NEUROLOGIC: Cranial nerves II through XII are grossly intact. Deep tendon reflexes are 2+ and equal bilaterally. Judgment and insight is  poor. Patient is oriented to person and vaguely to place, not to time.   LABORATORY, RADIOLOGICAL AND DIAGNOSTIC DATA: White blood cell count 12.3, hematocrit 43.6, platelet count 230, sodium 144, potassium 4.0, chloride 107, bicarbonate 27, BUN 19, creatinine 1.7 which is higher than his baseline of 1.4, glucose 102. Troponin less than 0.02. Urinalysis is cloudy with trace leukocyte esterase, 27 white blood cells, 3+ bacteria and 19 epithelial cells, nitrites are negative. Chest x-ray reveals emphysema and fibrotic changes, especially in the right hemithorax. Atelectasis versus infiltrate within the left lung base. No focal regions of consolidation. CT of the head shows no acute intracranial abnormalities.   ASSESSMENT AND PLAN: 81 year old man with multiple chronic medical problems in a difficult social situation presents with urinary tract infection, fall, weakness and confusion as well as a possible pneumonia.  1. Urinary tract infection: Multiple antibiotic allergies. I did try Keflex which caused vomiting, which is the same reaction he has when taking penicillin. Will dose with vancomycin 1 gram x1 and re-evaluate tomorrow. Culture of the urine is pending.  2. Weakness: Will get a physical therapy consult.  3. Confusion: This is a chronic problem for him. Likely worse in the setting of acute infection and hospitalization. Will treat with p.r.n. Zyprexa.  4. Acute on chronic renal insufficiency: Will gently hydrate. 5. Deep vein thrombosis  prophylaxis with TEDs and SCDs.   TOTAL TIME SPENT ON CARE AND COORDINATION: 45 minutes.  ____________________________ Lise Auer Manson Passey, MD jlb:cms D: 01/06/2012 18:28:16 ET T: 01/07/2012 06:50:48 ET JOB#: [id number]  cc: Victorino Dike L. Manson Passey, MD, <Dictator> Duanne Limerick, MD Eber Hong MD ELECTRONICALLY SIGNED 01/07/2012 20:09

## 2022-08-17 DEATH — deceased
